# Patient Record
Sex: Male | Born: 1935 | Hispanic: No | Marital: Married | Smoking: Never smoker
Health system: Southern US, Community
[De-identification: ages and names within clinical notes are randomized; demographics above are authoritative.]

## PROBLEM LIST (undated history)

## (undated) DIAGNOSIS — N400 Enlarged prostate without lower urinary tract symptoms: Secondary | ICD-10-CM

## (undated) DIAGNOSIS — J45909 Unspecified asthma, uncomplicated: Secondary | ICD-10-CM

## (undated) DIAGNOSIS — K219 Gastro-esophageal reflux disease without esophagitis: Secondary | ICD-10-CM

## (undated) DIAGNOSIS — IMO0001 Reserved for inherently not codable concepts without codable children: Secondary | ICD-10-CM

## (undated) DIAGNOSIS — M109 Gout, unspecified: Secondary | ICD-10-CM

## (undated) DIAGNOSIS — I10 Essential (primary) hypertension: Secondary | ICD-10-CM

## (undated) HISTORY — DX: Unspecified asthma, uncomplicated: J45.909

## (undated) HISTORY — PX: PROSTATECTOMY: SHX69

---

## 2009-11-19 ENCOUNTER — Emergency Department (HOSPITAL_COMMUNITY): Admission: EM | Admit: 2009-11-19 | Discharge: 2009-11-20 | Payer: Self-pay | Admitting: Emergency Medicine

## 2011-04-02 LAB — DIFFERENTIAL
Basophils Absolute: 0 K/uL (ref 0.0–0.1)
Basophils Relative: 0 % (ref 0–1)
Eosinophils Absolute: 0 K/uL (ref 0.0–0.7)
Eosinophils Relative: 0 % (ref 0–5)
Lymphocytes Relative: 42 % (ref 12–46)
Lymphs Abs: 2.4 K/uL (ref 0.7–4.0)
Monocytes Absolute: 0.6 10*3/uL (ref 0.1–1.0)
Monocytes Relative: 11 % (ref 3–12)
Neutro Abs: 2.7 10*3/uL (ref 1.7–7.7)
Neutrophils Relative %: 47 % (ref 43–77)

## 2011-04-02 LAB — D-DIMER, QUANTITATIVE: D-Dimer, Quant: 0.52 ug{FEU}/mL — ABNORMAL HIGH (ref 0.00–0.48)

## 2011-04-02 LAB — CBC
HCT: 42.2 % (ref 39.0–52.0)
Hemoglobin: 14 g/dL (ref 13.0–17.0)
MCHC: 33.2 g/dL (ref 30.0–36.0)
MCV: 87.8 fL (ref 78.0–100.0)
Platelets: 155 K/uL (ref 150–400)
RBC: 4.8 MIL/uL (ref 4.22–5.81)
RDW: 14.2 % (ref 11.5–15.5)
WBC: 5.7 10*3/uL (ref 4.0–10.5)

## 2011-04-02 LAB — POCT CARDIAC MARKERS
CKMB, poc: <1 ng/mL — ABNORMAL LOW (ref 1.0–8.0)
Myoglobin, poc: 52.5 ng/mL (ref 12–200)

## 2011-04-02 LAB — POCT I-STAT, CHEM 8
Glucose, Bld: 105 mg/dL — ABNORMAL HIGH (ref 70–99)
HCT: 44 % (ref 39.0–52.0)
Hemoglobin: 15 g/dL (ref 13.0–17.0)
Potassium: 4.1 mEq/L (ref 3.5–5.1)
Sodium: 143 mEq/L (ref 135–145)
TCO2: 26 mmol/L (ref 0–100)

## 2015-04-17 LAB — LIPID PANEL
Cholesterol: 208 — AB (ref 0–200)
LDL Cholesterol: 138

## 2015-04-17 LAB — BASIC METABOLIC PANEL: Creatinine: 1.3 (ref <=1.3)

## 2015-05-28 ENCOUNTER — Encounter (HOSPITAL_COMMUNITY): Payer: Self-pay | Admitting: *Deleted

## 2015-05-28 ENCOUNTER — Emergency Department (INDEPENDENT_AMBULATORY_CARE_PROVIDER_SITE_OTHER): Payer: No Typology Code available for payment source

## 2015-05-28 ENCOUNTER — Other Ambulatory Visit: Payer: Self-pay

## 2015-05-28 ENCOUNTER — Emergency Department (HOSPITAL_COMMUNITY)
Admission: EM | Admit: 2015-05-28 | Discharge: 2015-05-28 | Disposition: A | Discharge status: Nursing Home (Includes ICF) | Payer: No Typology Code available for payment source | Source: Home / Self Care | Attending: Emergency Medicine | Admitting: Emergency Medicine

## 2015-05-28 ENCOUNTER — Inpatient Hospital Stay (HOSPITAL_COMMUNITY)
Admission: EM | Admit: 2015-05-28 | Discharge: 2015-05-31 | DRG: 292 | Disposition: A | Discharge status: Home / Self Care | Payer: Medicaid Other | Attending: Internal Medicine | Admitting: Internal Medicine

## 2015-05-28 DIAGNOSIS — E785 Hyperlipidemia, unspecified: Secondary | ICD-10-CM | POA: Diagnosis present

## 2015-05-28 DIAGNOSIS — N400 Enlarged prostate without lower urinary tract symptoms: Secondary | ICD-10-CM | POA: Diagnosis present

## 2015-05-28 DIAGNOSIS — R7989 Other specified abnormal findings of blood chemistry: Secondary | ICD-10-CM | POA: Insufficient documentation

## 2015-05-28 DIAGNOSIS — K219 Gastro-esophageal reflux disease without esophagitis: Secondary | ICD-10-CM | POA: Diagnosis present

## 2015-05-28 DIAGNOSIS — I5031 Acute diastolic (congestive) heart failure: Secondary | ICD-10-CM | POA: Insufficient documentation

## 2015-05-28 DIAGNOSIS — R0602 Shortness of breath: Secondary | ICD-10-CM | POA: Diagnosis present

## 2015-05-28 DIAGNOSIS — N183 Chronic kidney disease, stage 3 (moderate): Secondary | ICD-10-CM | POA: Diagnosis present

## 2015-05-28 DIAGNOSIS — R609 Edema, unspecified: Secondary | ICD-10-CM

## 2015-05-28 DIAGNOSIS — R778 Other specified abnormalities of plasma proteins: Secondary | ICD-10-CM | POA: Insufficient documentation

## 2015-05-28 DIAGNOSIS — I350 Nonrheumatic aortic (valve) stenosis: Secondary | ICD-10-CM | POA: Diagnosis present

## 2015-05-28 DIAGNOSIS — I129 Hypertensive chronic kidney disease with stage 1 through stage 4 chronic kidney disease, or unspecified chronic kidney disease: Secondary | ICD-10-CM | POA: Diagnosis present

## 2015-05-28 DIAGNOSIS — N179 Acute kidney failure, unspecified: Secondary | ICD-10-CM | POA: Diagnosis present

## 2015-05-28 DIAGNOSIS — Z7901 Long term (current) use of anticoagulants: Secondary | ICD-10-CM | POA: Diagnosis not present

## 2015-05-28 DIAGNOSIS — R0609 Other forms of dyspnea: Secondary | ICD-10-CM

## 2015-05-28 DIAGNOSIS — I248 Other forms of acute ischemic heart disease: Secondary | ICD-10-CM | POA: Diagnosis present

## 2015-05-28 DIAGNOSIS — R600 Localized edema: Secondary | ICD-10-CM | POA: Diagnosis present

## 2015-05-28 DIAGNOSIS — I5033 Acute on chronic diastolic (congestive) heart failure: Secondary | ICD-10-CM | POA: Diagnosis present

## 2015-05-28 DIAGNOSIS — I10 Essential (primary) hypertension: Secondary | ICD-10-CM | POA: Diagnosis present

## 2015-05-28 DIAGNOSIS — N189 Chronic kidney disease, unspecified: Secondary | ICD-10-CM | POA: Insufficient documentation

## 2015-05-28 DIAGNOSIS — R010 Benign and innocent cardiac murmurs: Secondary | ICD-10-CM

## 2015-05-28 DIAGNOSIS — Z7982 Long term (current) use of aspirin: Secondary | ICD-10-CM

## 2015-05-28 DIAGNOSIS — R9431 Abnormal electrocardiogram [ECG] [EKG]: Secondary | ICD-10-CM

## 2015-05-28 DIAGNOSIS — M1000 Idiopathic gout, unspecified site: Secondary | ICD-10-CM | POA: Diagnosis present

## 2015-05-28 DIAGNOSIS — R011 Cardiac murmur, unspecified: Secondary | ICD-10-CM

## 2015-05-28 HISTORY — DX: Reserved for inherently not codable concepts without codable children: IMO0001

## 2015-05-28 HISTORY — DX: Essential (primary) hypertension: I10

## 2015-05-28 HISTORY — DX: Gastro-esophageal reflux disease without esophagitis: K21.9

## 2015-05-28 HISTORY — DX: Benign prostatic hyperplasia without lower urinary tract symptoms: N40.0

## 2015-05-28 HISTORY — DX: Gout, unspecified: M10.9

## 2015-05-28 LAB — I-STAT TROPONIN, ED: TROPONIN I, POC: 0.02 ng/mL (ref 0.00–0.08)

## 2015-05-28 LAB — BASIC METABOLIC PANEL
ANION GAP: 4 — AB (ref 5–15)
BUN: 22 mg/dL — AB (ref 6–20)
CALCIUM: 9.2 mg/dL (ref 8.9–10.3)
CHLORIDE: 112 mmol/L — AB (ref 101–111)
CO2: 25 mmol/L (ref 22–32)
Creatinine, Ser: 1.45 mg/dL — ABNORMAL HIGH (ref 0.61–1.24)
GFR calc Af Amer: 52 mL/min — ABNORMAL LOW (ref >60)
GFR calc non Af Amer: 45 mL/min — ABNORMAL LOW (ref >60)
Glucose, Bld: 90 mg/dL (ref 65–99)
POTASSIUM: 4.1 mmol/L (ref 3.5–5.1)
SODIUM: 141 mmol/L (ref 135–145)

## 2015-05-28 LAB — HEPATIC FUNCTION PANEL
ALK PHOS: 85 U/L (ref 38–126)
ALT: 13 U/L — AB (ref 17–63)
AST: 13 U/L — ABNORMAL LOW (ref 15–41)
Albumin: 3.6 g/dL (ref 3.5–5.0)
BILIRUBIN TOTAL: 0.6 mg/dL (ref 0.3–1.2)
TOTAL PROTEIN: 6.7 g/dL (ref 6.5–8.1)

## 2015-05-28 LAB — CBC
HEMATOCRIT: 35.7 % — AB (ref 39.0–52.0)
HEMOGLOBIN: 11.4 g/dL — AB (ref 13.0–17.0)
MCH: 26.1 pg (ref 26.0–34.0)
MCHC: 31.9 g/dL (ref 30.0–36.0)
MCV: 81.9 fL (ref 78.0–100.0)
Platelets: 146 10*3/uL — ABNORMAL LOW (ref 150–400)
RBC: 4.36 MIL/uL (ref 4.22–5.81)
RDW: 15.3 % (ref 11.5–15.5)
WBC: 6 10*3/uL (ref 4.0–10.5)

## 2015-05-28 LAB — BRAIN NATRIURETIC PEPTIDE: B Natriuretic Peptide: 415.5 pg/mL — ABNORMAL HIGH (ref 0.0–100.0)

## 2015-05-28 MED ORDER — FUROSEMIDE 10 MG/ML IJ SOLN
20.0000 mg | Freq: Every day | INTRAMUSCULAR | Status: DC
  Administered 2015-05-29: 20 mg via INTRAVENOUS
  Filled 2015-05-28 (×2): qty 2

## 2015-05-28 MED ORDER — CARVEDILOL 12.5 MG PO TABS
12.5000 mg | ORAL_TABLET | Freq: Two times a day (BID) | ORAL | Status: DC
  Administered 2015-05-29 (×2): 12.5 mg via ORAL
  Filled 2015-05-28 (×6): qty 1

## 2015-05-28 MED ORDER — HEPARIN SODIUM (PORCINE) 5000 UNIT/ML IJ SOLN
5000.0000 [IU] | Freq: Three times a day (TID) | INTRAMUSCULAR | Status: DC
  Administered 2015-05-29 – 2015-05-31 (×8): 5000 [IU] via SUBCUTANEOUS
  Filled 2015-05-28 (×11): qty 1

## 2015-05-28 MED ORDER — HYDRALAZINE HCL 20 MG/ML IJ SOLN
5.0000 mg | Freq: Once | INTRAMUSCULAR | Status: AC
  Administered 2015-05-28: 5 mg via INTRAVENOUS
  Filled 2015-05-28: qty 1

## 2015-05-28 MED ORDER — ASPIRIN EC 81 MG PO TBEC
81.0000 mg | DELAYED_RELEASE_TABLET | Freq: Every day | ORAL | Status: DC
  Administered 2015-05-29 – 2015-05-31 (×3): 81 mg via ORAL
  Filled 2015-05-28 (×3): qty 1

## 2015-05-28 MED ORDER — SODIUM CHLORIDE 0.9 % IV SOLN
250.0000 mL | INTRAVENOUS | Status: DC | PRN
  Administered 2015-05-29: 250 mL via INTRAVENOUS

## 2015-05-28 MED ORDER — FUROSEMIDE 10 MG/ML IJ SOLN
40.0000 mg | Freq: Once | INTRAMUSCULAR | Status: AC
  Administered 2015-05-28: 40 mg via INTRAVENOUS
  Filled 2015-05-28: qty 4

## 2015-05-28 MED ORDER — HYDRALAZINE HCL 20 MG/ML IJ SOLN: 5.0000 mg | INTRAMUSCULAR | Status: DC | PRN

## 2015-05-28 MED ORDER — ONDANSETRON HCL 4 MG/2ML IJ SOLN: 4.0000 mg | Freq: Four times a day (QID) | INTRAMUSCULAR | Status: DC | PRN

## 2015-05-28 MED ORDER — ACETAMINOPHEN 325 MG PO TABS: 650.0000 mg | ORAL_TABLET | ORAL | Status: DC | PRN

## 2015-05-28 MED ORDER — SODIUM CHLORIDE 0.9 % IJ SOLN
3.0000 mL | Freq: Two times a day (BID) | INTRAMUSCULAR | Status: DC
  Administered 2015-05-29 – 2015-05-30 (×4): 3 mL via INTRAVENOUS

## 2015-05-28 MED ORDER — SODIUM CHLORIDE 0.9 % IJ SOLN
3.0000 mL | INTRAMUSCULAR | Status: DC | PRN
  Administered 2015-05-29: 3 mL via INTRAVENOUS
  Filled 2015-05-28: qty 3

## 2015-05-28 NOTE — ED Provider Notes (Signed)
CSN: [id number]     Arrival date & time 05/28/15  1755 History   First MD Initiated Contact with Patient 05/28/15 1922     Chief Complaint  Patient presents with  . Shortness of Breath     (Consider location/radiation/quality/duration/timing/severity/associated sxs/prior Treatment) Patient is a 79 y.o. male presenting with shortness of breath. The history is provided by the patient. No language interpreter was used.  Shortness of Breath Severity:  Moderate Onset quality:  Gradual Duration:  3 days Timing:  Constant Progression:  Worsening Chronicity:  New Context: URI   Relieved by:  Nothing Worsened by:  Nothing tried Ineffective treatments:  None tried Associated symptoms: no abdominal pain, no chest pain, no cough and no diaphoresis   Pt seen at Urgent care by Dr. Piedad Climes and sent to ED for evaluation second to murmur and shortness of breath.  Pt reports they were told pt needs an Echo and labs.   Past Medical History  Diagnosis Date  . Hypertension    History reviewed. No pertinent past surgical history. History reviewed. No pertinent family history. History  Substance Use Topics  . Smoking status: Never Smoker   . Smokeless tobacco: Not on file  . Alcohol Use: No    Review of Systems  Constitutional: Negative for diaphoresis.  HENT: Negative for congestion.   Respiratory: Positive for shortness of breath. Negative for cough.   Cardiovascular: Negative for chest pain.  Gastrointestinal: Negative for nausea, abdominal pain and diarrhea.  All other systems reviewed and are negative.     Allergies  Review of patient's allergies indicates no known allergies.  Home Medications   Prior to Admission medications   Medication Sig Start Date End Date Taking? Authorizing Provider  carvedilol (COREG) 12.5 MG tablet Take 12.5 mg by mouth 2 (two) times daily with a meal.    Historical Provider, MD   BP 188/86 mmHg  Pulse 65  Temp(Src) 98 F (36.7 C) (Oral)  Resp 16   SpO2 100% Physical Exam  Constitutional: He appears well-developed and well-nourished.  HENT:  Head: Normocephalic.  Right Ear: External ear normal.  Left Ear: External ear normal.  Eyes: Conjunctivae and EOM are normal. Pupils are equal, round, and reactive to light.  Neck: Normal range of motion. Neck supple.  Cardiovascular: Normal rate and intact distal pulses.   Murmur heard. Loud murmur  Pulmonary/Chest: Effort normal and breath sounds normal. He exhibits no tenderness.  Abdominal: Soft.  Musculoskeletal: Normal range of motion.  Neurological: He is alert.  Skin: Skin is warm.  Psychiatric: He has a normal mood and affect.  Nursing note and vitals reviewed.   ED Course  Procedures (including critical care time) Labs Review Labs Reviewed  CBC - Abnormal; Notable for the following:    Hemoglobin 11.4 (*)    HCT 35.7 (*)    Platelets 146 (*)    All other components within normal limits  BASIC METABOLIC PANEL - Abnormal; Notable for the following:    Chloride 112 (*)    BUN 22 (*)    Creatinine, Ser 1.45 (*)    GFR calc non Af Amer 45 (*)    GFR calc Af Amer 52 (*)    Anion gap 4 (*)    All other components within normal limits  BRAIN NATRIURETIC PEPTIDE  I-STAT TROPOININ, ED    Imaging Review Dg Chest 2 View  05/28/2015   CLINICAL DATA:  Shortness of breath  EXAM: CHEST  2 VIEW  COMPARISON:  CTA chest dated 01/19/2009  FINDINGS: Lungs are clear.  No pleural effusion or pneumothorax.  The heart is top-normal in size.  Mild degenerative changes of the visualized thoracolumbar spine.  IMPRESSION: No evidence of acute cardiopulmonary disease.   Electronically Signed   By: Charline BillsSriyesh  Krishnan M.D.   On: 05/28/2015 17:21     EKG Interpretation   Date/Time:  Monday May 28 2015 21:25:18 EDT Ventricular Rate:  65 PR Interval:  157 QRS Duration: 87 QT Interval:  389 QTC Calculation: 404 R Axis:   61 Text Interpretation:  Sinus rhythm Biatrial enlargement LVH with  secondary  repolarization abnormality Anterior ST elevation, probably due to LVH  Confirmed by POLLINA  MD, CHRISTOPHER [phone]([id number]) on 05/28/2015 9:29:00 PM      MDM  Urgent care notes reviewed,  Chest xray and ekg reviewed.     slight elevation of bun and creat,  BNp elevated at 415.  Pt has loud murmur,  Family reports they are unaware of pt having a murmur in the past. Iv lasix ordered   Final diagnoses:  Shortness of breath  Edema    I spoke to Dr. Clyde LundborgNiu who will admit for evaluation and treatment.    Lonia SkinnerLeslie K AlmenaSofia, PA-C 05/28/15 2211  Gilda Creasehristopher J Pollina, MD 05/28/15 2217

## 2015-05-28 NOTE — ED Notes (Signed)
Pt  Reports  He  Is   Short  Of  Breath       -     Pt  Reports       He  Uses  An   Inhaler            [place]  [place]  [place]  [place]             He  denys  Any  Chest pain     -  Family   Reports  He  Feet  Are  Swelling         He is  Sitting  Upright  On  The  Exam table  And  He  Is  In no  Acute  /  Severe  Distress     Skin is  Warm      And  Dry

## 2015-05-28 NOTE — ED Notes (Signed)
Pt placed in a gown and hooked up to the monitor 5 lead, BP cuff and pulse ox

## 2015-05-28 NOTE — H&P (Signed)
[facility] History and Physical  Draydon Clairmont ZOX:[id number] DOB: [birth year] DOA: 05/28/2015  Referring physician: ED physician PCP: No primary care provider on file.  Specialists:   Chief Complaint: Shortness of breath  HPI: [name] is a 79 y.o. male with PMH of hypertension, BPH, GERD, gout, who presents with shortness breath.  Patient cannot speaking English, is accompanied by her son and daughter-in-law who translated history. Per her son, patient started having shortness of breath since yesterday. His shortness of breath is getting worse with laying down. No cough, fever, chills, chest pain. He also reports having bilateral leg edema in the past several days. Patient does not have history of recent long distant traveling. No tenderness over calf areas.  Currently patient denies fever, chills, running nose, ear pain, headaches, cough, chest pain, abdominal pain, diarrhea, constipation, dysuria, urgency, frequency, hematuria, skin rashes, joint pain. No unilateral weakness, numbness or tingling sensations. No vision change or hearing loss.  In ED, patient was found to have AKI, negative troponin, BNP 415.5, CBC 6.0, negative chest x-ray, temperature normal, no tachycardia. 3/6 harsh systolic murmur over aortic area. Patient is admitted to inpatient for further evaluation and treatment.  Where does patient live?   At home    Can patient participate in ADLs?   Some   Review of Systems:   General: no fevers, chills, has poor appetite, has fatigue HEENT: no blurry vision, hearing changes or sore throat Pulm: has dyspnea, No coughing, wheezing CV: no chest pain, palpitations Abd: no nausea, vomiting, abdominal pain, diarrhea, constipation GU: no dysuria, burning on urination, increased urinary frequency, hematuria  Ext: has leg edema Neuro: no unilateral weakness, numbness, or tingling, no vision change or hearing loss Skin: no rash MSK: No muscle spasm, no deformity, no  limitation of range of movement in spin Heme: No easy bruising.  Travel history: No recent long distant travel.  Allergy: No Known Allergies  Past Medical History  Diagnosis Date  . Hypertension   . Gouty arthritis of toe of right foot   . GERD (gastroesophageal reflux disease)   . Shortness of breath dyspnea   . BPH (benign prostatic hyperplasia)     Past Surgical History  Procedure Laterality Date  . Prostatectomy      Social History:  reports that he has never smoked. He does not have any smokeless tobacco history on file. He reports that he does not drink alcohol. His drug history is not on file.  Family History:  Family History  Problem Relation Age of Onset  . Hypertension Father   . Hypertension Sister   . Hypertension Brother      Prior to Admission medications   Medication Sig Start Date End Date Taking? Authorizing Provider  carvedilol (COREG) 12.5 MG tablet Take 12.5 mg by mouth 2 (two) times daily with a meal.   Yes Historical Provider, MD    Physical Exam: Filed Vitals:   05/28/15 2145 05/28/15 2200 05/28/15 2230 05/28/15 2330  BP:  171/76 170/71 158/67  Pulse: 66 66 84 81  Temp:    98.3 F (36.8 C)  TempSrc:    Oral  Resp: Height:     (1.6 m)  Weight:    62.642 kg (138 lb 1.6 oz)  SpO2: 100% 100% 100% 100%   General: Not in acute distress HEENT:       Eyes: PERRL, EOMI, no scleral icterus.       ENT: No discharge from the  ears and nose, no pharynx injection, no tonsillar enlargement.        Neck: positive JVD, no bruit, no mass felt. Heme: No neck lymph node enlargement. Cardiac: S1/S2, RRR, 3/6 harsh systolic murmurs, No gallops or rubs. Pulm: has fine crackles bilaterally. No wheezing, rhonchi or rubs. Abd: Soft, nondistended, nontender, no rebound pain, no organomegaly, BS present. Ext: 1+ pitting leg edema bilaterally. 2+DP/PT pulse bilaterally. Musculoskeletal: No joint deformities, No joint redness or warmth, no  limitation of ROM in spin. Skin: No rashes.  Neuro: Alert, oriented X3, cranial nerves II-XII grossly intact, muscle strength 5/5 in all extremities, sensation to light touch intact.  Psych: Patient is not psychotic, no suicidal or hemocidal ideation.  Labs on Admission:  Basic Metabolic Panel:  Recent Labs Lab 05/28/15 1813  NA 141  K 4.1  CL 112*  CO2 25  GLUCOSE 90  BUN 22*  CREATININE 1.45*  CALCIUM 9.2   Liver Function Tests:  Recent Labs Lab 05/28/15 1813  AST 13*  ALT 13*  ALKPHOS 85  BILITOT 0.6  PROT 6.7  ALBUMIN 3.6   No results for input(s): LIPASE, AMYLASE in the last 168 hours. No results for input(s): AMMONIA in the last 168 hours. CBC:  Recent Labs Lab 05/28/15 1813  WBC 6.0  HGB 11.4*  HCT 35.7*  MCV 81.9  PLT 146*   Cardiac Enzymes:  Recent Labs Lab 05/29/15 0022  TROPONINI 0.04*    BNP (last 3 results)  Recent Labs  05/28/15 1813  BNP 415.5*    ProBNP (last 3 results) No results for input(s): PROBNP in the last 8760 hours.  CBG: No results for input(s): GLUCAP in the last 168 hours.  Radiological Exams on Admission: Dg Chest 2 View  05/28/2015   CLINICAL DATA:  Shortness of breath  EXAM: CHEST  2 VIEW  COMPARISON:  CTA chest dated 01/19/2009  FINDINGS: Lungs are clear.  No pleural effusion or pneumothorax.  The heart is top-normal in size.  Mild degenerative changes of the visualized thoracolumbar spine.  IMPRESSION: No evidence of acute cardiopulmonary disease.   Electronically Signed   By: Charline BillsSriyesh  Krishnan M.D.   On: 05/28/2015 17:21    EKG: Independently reviewed.  Abnormal findings: T-wave inversion in lateral leads, inferior leads and V5, bilateral atrial enlargement   Assessment/Plan Principal Problem:   SOB (shortness of breath) Active Problems:   AKI (acute kidney injury)   Bilateral leg edema   Essential hypertension  SOB: It is most likely caused by acute congestive heart failure. Likely due to aortic  stenosis given his harsh systolic murmur over aortic areas. No pneumonia on chest x-ray. Pulmonary embolism is unlikely given no any chest pain.  -will admit to Telemetry bed. -Patient received 1 dose of Lasix 40 mg by IV in emergency room, will decrease to 20 mg daily given patient is Lasix nave.  -start ASA  -continue home Coreg -will cycle CE X3 -will check TSH, A1c, FLP -will get 2-D echo to evaluate EF -strict In/Out -Daily body weight. -heart diet -No on ACEI because of AKI  HTN: Blood pressure is elevated at 158/67 on admission. The patient is only taking Coreg at home, 12.5 mg twice a day. -Continue Coreg -On Lasix as above -Hydralazine IV when necessary  AKI: Likely secondary to cardiorenal syndrom - will give low dose of lasix, with understanding that this may worsen her renal function. However, I am hoping this will not happen due to the Starling mechanism. Her  kidney may be better perfused with improved cardiac function with good diuresis. -Check FeNa  -US-renal -Follow up renal function by BMP -Avoid ACEI and NSAIDs   DVT ppx: SQ Heparin    Code Status: Full code Family Communication: yes, patient's son and daughter-in-law at bed side Disposition Plan: Admit to inpatient   Date of Service 05/29/2015    Lorretta Harp [facility] Pager [phone]  If 7PM-7AM, please contact night-coverage [email or web address] Password [email or web address] 05/29/2015, 3:13 AM

## 2015-05-28 NOTE — ED Provider Notes (Signed)
Patient presented to the ER with shortness of breath. Patient reports that over the last few years he has noticed intermittent difficulty breathing that he has attributed to allergies. In the last 2 days, however, he has noticed increased shortness of breath. He has had swelling of his legs and has noticed that he gets very short of breath if he exerts himself. He cannot walk up stairs or walk any distance because of severe shortness of breath. This is a drastic change. He has not had any chest pain associated with symptoms.  Face to face Exam: HEENT - PERRLA Lungs - decreased bilateral breath sounds Heart - RRR, grade 3/6 systolic murmur at steral border, radiates to carotids Abd - S/NT/ND Neuro - alert, oriented x3 Extremities - 1+ pitting edema  Plan: Presents to the ER for evaluation of lower extremity swelling, shortness of breath and severe dyspnea on exertion. Patient has a murmur which family is unaware of previously. Murmur is consistent with aortic stenosis murmur. Patient does have a mildly elevated BNP, likely some mild CHF. Patient will require hospitalization for further workup and blood pressure control. Will likely require echo.  Gilda Creasehristopher J Mckinna Demars, MD 05/28/15 2121

## 2015-05-28 NOTE — ED Notes (Signed)
Pt sent over from urgent care with an abnormal EKG, went there for shortness of breath and swelling to his feet, no distress noted

## 2015-05-28 NOTE — ED Provider Notes (Signed)
CSN: [id number]     Arrival date & time 05/28/15  1425 History   First MD Initiated Contact with Patient 05/28/15 1644     Chief Complaint  Patient presents with  . Shortness of Breath   (Consider location/radiation/quality/duration/timing/severity/associated sxs/prior Treatment) HPI  He is a 79 year old man here with his family for evaluation of shortness of breath. Family members act as interpreters. He reports shortness of breath that started yesterday. He also states he has had swelling in his legs for the last 2 days. He has never had this swelling before. He denies any fevers, nasal congestion, sore throat, cough. No chest pain. He has a history of hypertension and is taking Coreg for this. He has been taking his medication. He does not have a doctor yet. His daughter states that she was instructed to call the office after June 6 to get him a new patient appointment.  Past Medical History  Diagnosis Date  . Hypertension    History reviewed. No pertinent past surgical history. History reviewed. No pertinent family history. History  Substance Use Topics  . Smoking status: Never Smoker   . Smokeless tobacco: Not on file  . Alcohol Use: No    Review of Systems As in history of present illness Allergies  Review of patient's allergies indicates no known allergies.  Home Medications   Prior to Admission medications   Medication Sig Start Date End Date Taking? Authorizing Provider  carvedilol (COREG) 12.5 MG tablet Take 12.5 mg by mouth 2 (two) times daily with a meal.   Yes Historical Provider, MD   BP 182/72 mmHg  Pulse 63  Temp(Src) 98.3 F (36.8 C) (Oral)  Resp 24  SpO2 98% Physical Exam  Constitutional: He is oriented to person, place, and time. He appears well-developed and well-nourished. No distress.  Neck: Neck supple.  Cardiovascular: Normal rate and regular rhythm.   Murmur (harsh systolic ejection murmur at right upper sternal border) heard. Pulmonary/Chest:  Effort normal and breath sounds normal. No respiratory distress. He has no wheezes. He has no rales.  Musculoskeletal: He exhibits edema (2+ in bilateral lower extremities to upper shins). He exhibits no tenderness.  Neurological: He is alert and oriented to person, place, and time.    ED Course  Procedures (including critical care time) ED ECG REPORT   Date: 05/28/2015  Rate: 60  Rhythm: normal sinus rhythm  QRS Axis: normal  Intervals: normal  ST/T Wave abnormalities: T wave inversions in I, II, III, aVF, V4-6  Conduction Disutrbances:none  Narrative Interpretation: NSR with T wave inversions in inferiolateral leads, concern for strain vs LVH with repolarization abnormality  Old EKG Reviewed: none available  I have personally reviewed the EKG tracing and agree with the computerized printout as noted.  Labs Review Labs Reviewed - No data to display  Imaging Review Dg Chest 2 View  05/28/2015   CLINICAL DATA:  Shortness of breath  EXAM: CHEST  2 VIEW  COMPARISON:  CTA chest dated 01/19/2009  FINDINGS: Lungs are clear.  No pleural effusion or pneumothorax.  The heart is top-normal in size.  Mild degenerative changes of the visualized thoracolumbar spine.  IMPRESSION: No evidence of acute cardiopulmonary disease.   Electronically Signed   By: Charline Bills M.D.   On: 05/28/2015 17:21     MDM   1. Heart murmur previously undiagnosed   2. Shortness of breath   3. Abnormal EKG    With new diagnosis of heart murmur and abnormality seen on  EKG, [facility]. His vital signs are stable and he is without chest pain. He is stable for transfer via shuttle.    Charm RingsErin J Jarome Trull, MD 05/28/15 1726

## 2015-05-29 ENCOUNTER — Inpatient Hospital Stay (HOSPITAL_COMMUNITY): Payer: Medicaid Other

## 2015-05-29 ENCOUNTER — Encounter (HOSPITAL_COMMUNITY): Payer: Self-pay | Admitting: Internal Medicine

## 2015-05-29 DIAGNOSIS — I110 Hypertensive heart disease with heart failure: Secondary | ICD-10-CM

## 2015-05-29 DIAGNOSIS — I5030 Unspecified diastolic (congestive) heart failure: Secondary | ICD-10-CM

## 2015-05-29 DIAGNOSIS — I509 Heart failure, unspecified: Secondary | ICD-10-CM

## 2015-05-29 DIAGNOSIS — I350 Nonrheumatic aortic (valve) stenosis: Secondary | ICD-10-CM

## 2015-05-29 DIAGNOSIS — R0602 Shortness of breath: Secondary | ICD-10-CM

## 2015-05-29 DIAGNOSIS — I10 Essential (primary) hypertension: Secondary | ICD-10-CM

## 2015-05-29 LAB — LIPID PANEL
Cholesterol: 255 mg/dL — ABNORMAL HIGH (ref 0–200)
HDL: 43 mg/dL (ref >40)
LDL CALC: 189 mg/dL — AB (ref 0–99)
TRIGLYCERIDES: 114 mg/dL (ref <150)
Total CHOL/HDL Ratio: 5.9 RATIO
VLDL: 23 mg/dL (ref 0–40)

## 2015-05-29 LAB — PROTIME-INR
INR: 1.2 (ref 0.00–1.49)
Prothrombin Time: 15.3 seconds — ABNORMAL HIGH (ref 11.6–15.2)

## 2015-05-29 LAB — BASIC METABOLIC PANEL
Anion gap: 9 (ref 5–15)
BUN: 24 mg/dL — AB (ref 6–20)
CHLORIDE: 108 mmol/L (ref 101–111)
CO2: 23 mmol/L (ref 22–32)
Calcium: 9.2 mg/dL (ref 8.9–10.3)
Creatinine, Ser: 1.43 mg/dL — ABNORMAL HIGH (ref 0.61–1.24)
GFR calc non Af Amer: 46 mL/min — ABNORMAL LOW (ref >60)
GFR, EST AFRICAN AMERICAN: 53 mL/min — AB (ref >60)
Glucose, Bld: 89 mg/dL (ref 65–99)
Potassium: 3.5 mmol/L (ref 3.5–5.1)
Sodium: 140 mmol/L (ref 135–145)

## 2015-05-29 LAB — HEPATIC FUNCTION PANEL
ALK PHOS: 91 U/L (ref 38–126)
ALT: 14 U/L — ABNORMAL LOW (ref 17–63)
AST: 13 U/L — ABNORMAL LOW (ref 15–41)
Albumin: 3.5 g/dL (ref 3.5–5.0)
BILIRUBIN INDIRECT: 0.5 mg/dL (ref 0.3–0.9)
BILIRUBIN TOTAL: 0.6 mg/dL (ref 0.3–1.2)
Bilirubin, Direct: 0.1 mg/dL (ref 0.1–0.5)
Total Protein: 7.1 g/dL (ref 6.5–8.1)

## 2015-05-29 LAB — TROPONIN I
TROPONIN I: 0.05 ng/mL — AB (ref <0.031)
TROPONIN I: 0.04 ng/mL — AB (ref <0.031)
Troponin I: 0.04 ng/mL — ABNORMAL HIGH (ref <0.031)

## 2015-05-29 LAB — TSH: TSH: 3.411 u[IU]/mL (ref 0.350–4.500)

## 2015-05-29 LAB — CREATININE, URINE, RANDOM: CREATININE, URINE: 37.53 mg/dL

## 2015-05-29 LAB — MAGNESIUM: Magnesium: 2 mg/dL (ref 1.7–2.4)

## 2015-05-29 MED ORDER — REGADENOSON 0.4 MG/5ML IV SOLN
0.4000 mg | Freq: Once | INTRAVENOUS | Status: AC
  Administered 2015-05-30: 0.4 mg via INTRAVENOUS
  Filled 2015-05-29: qty 5

## 2015-05-29 MED ORDER — POLYETHYLENE GLYCOL 3350 17 G PO PACK
17.0000 g | PACK | Freq: Every day | ORAL | Status: DC
  Administered 2015-05-29: 17 g via ORAL
  Filled 2015-05-29 (×3): qty 1

## 2015-05-29 MED ORDER — LOSARTAN POTASSIUM 25 MG PO TABS
25.0000 mg | ORAL_TABLET | Freq: Every day | ORAL | Status: DC
  Administered 2015-05-29: 25 mg via ORAL
  Filled 2015-05-29 (×2): qty 1

## 2015-05-29 MED ORDER — ATORVASTATIN CALCIUM 40 MG PO TABS
40.0000 mg | ORAL_TABLET | Freq: Every day | ORAL | Status: DC
  Administered 2015-05-30: 40 mg via ORAL
  Filled 2015-05-29 (×2): qty 1

## 2015-05-29 NOTE — Progress Notes (Signed)
  Echocardiogram 2D Echocardiogram has been performed.  [name] FRANCES 05/29/2015, 10:04 AM

## 2015-05-29 NOTE — Progress Notes (Signed)
[facility] PROGRESS NOTE   [name] HQI:[id number] DOB: 02-Sep-1936 DOA: 05/28/2015 PCP: No primary care provider on file.  HPI/Subjective: Denies chest pain, still has some shortness of breath especially with ambulation. Son and daughter low at bedside translating for Arabic.  Assessment/Plan: Principal Problem:   SOB (shortness of breath) Active Problems:   AKI (acute kidney injury)   Bilateral leg edema   Essential hypertension   Shortness of breath Patient admitted to the hospital with exertional dyspnea, progressively worsened shortness of breath. Patient has had lower extremity edema and orthopnea. Resolved by now. Patient received Fields Lasix and his Coreg continued. 2-D echo did not show significant findings, grade 1 diastolic dysfunction, ejection fraction of 65-70%. Last cardiology to help with exertional dyspnea.  Moderate aortic stenosis Patient has very significant murmur, 2-D echocardiogram showed aortic valve surface area of 1.1 cm. Peak and mean gradients 45 and 26 mmHg respectively.  Acute kidney injury Patient started on small dose of Lasix yesterday, given 40 mg in the ED and 20 daily. Creatinine about the same as yesterday 1.4., Baseline from 6 years ago 1.1.  Essential hypertension Elevated blood pressure, restarted Coreg. We'll adjust medications accordingly.  Code Status: Full Code Family Communication: Plan discussed with the patient. Disposition Plan: Remains inpatient Diet: Diet 2 gram sodium Room service appropriate?: Yes; Fluid consistency:: Thin  Consultants:  Cardiology  Procedures:  None  Antibiotics:  None   Objective: Filed Vitals:   05/29/15 1356  BP: 172/67  Pulse:   Temp:   Resp:     Intake/Output Summary (Last 24 hours) at 05/29/15 1542 Last data filed at 05/29/15 1356  Gross per 24 hour  Intake    240 ml  Output    250 ml  Net    -10 ml   Filed Weights   05/28/15 2330 05/29/15 0541  Weight: 62.642 kg  (138 lb 1.6 oz) 62.324 kg (137 lb 6.4 oz)    Exam: General: Alert and awake, oriented x3, not in any acute distress. HEENT: anicteric sclera, pupils reactive to light and accommodation, EOMI CVS: S1-S2 clear, no murmur rubs or gallops Chest: clear to auscultation bilaterally, no wheezing, rales or rhonchi Abdomen: soft nontender, nondistended, normal bowel sounds, no organomegaly Extremities: no cyanosis, clubbing or edema noted bilaterally Neuro: Cranial nerves II-XII intact, no focal neurological deficits  Data Reviewed: Basic Metabolic Panel:  Recent Labs Lab 05/28/15 1813 05/29/15 0540  NA 141 140  K 4.1 3.5  CL 112* 108  CO2 25 23  GLUCOSE 90 89  BUN 22* 24*  CREATININE 1.45* 1.43*  CALCIUM 9.2 9.2  MG  --  2.0   Liver Function Tests:  Recent Labs Lab 05/28/15 1813  AST 13*  ALT 13*  ALKPHOS 85  BILITOT 0.6  PROT 6.7  ALBUMIN 3.6   No results for input(s): LIPASE, AMYLASE in the last 168 hours. No results for input(s): AMMONIA in the last 168 hours. CBC:  Recent Labs Lab 05/28/15 1813  WBC 6.0  HGB 11.4*  HCT 35.7*  MCV 81.9  PLT 146*   Cardiac Enzymes:  Recent Labs Lab 05/29/15 0022 05/29/15 0540 05/29/15 1200  TROPONINI 0.04* 0.05* 0.04*   BNP (last 3 results)  Recent Labs  05/28/15 1813  BNP 415.5*    ProBNP (last 3 results) No results for input(s): PROBNP in the last 8760 hours.  CBG: No results for input(s): GLUCAP in the last 168 hours.  Micro No results found for this or any previous  visit (from the past 240 hour(s)).   Studies: Dg Chest 2 View  05/28/2015   CLINICAL DATA:  Shortness of breath  EXAM: CHEST  2 VIEW  COMPARISON:  CTA chest dated 01/19/2009  FINDINGS: Lungs are clear.  No pleural effusion or pneumothorax.  The heart is top-normal in size.  Mild degenerative changes of the visualized thoracolumbar spine.  IMPRESSION: No evidence of acute cardiopulmonary disease.   Electronically Signed   By: Carlos BillsSriyesh  Fields  M.D.   On: 05/28/2015 17:21   [place] Renal  05/29/2015   CLINICAL DATA:  Acute kidney injury.  Initial encounter.  EXAM: RENAL / URINARY TRACT ULTRASOUND COMPLETE  COMPARISON:  Limited correlation made with chest CT 11/19/2009.  FINDINGS: Right Kidney:  Length: 9.7 cm. Mild renal cortical thinning and increased echogenicity. There is Fields large cyst in the upper pole measuring 6.1 x 5.3 x 5.1 cm. There is an adjacent smaller cyst measuring 1.5 cm maximally. No hydronephrosis.  Left Kidney:  Length: 8.6 cm. Mild renal cortical thinning and increased echogenicity there is Fields small probable cyst in the lower interpolar region measuring 1.2 cm maximally. No hydronephrosis.  Bladder:  Appears normal for degree of bladder distention. Bilateral ureteral jets noted. The prostate gland is mildly enlarged, measure up to 3.1 cm.  IMPRESSION: 1. Both kidneys demonstrate mild cortical thinning and increased echogenicity consistent with chronic medical renal disease. 2. No evidence of hydronephrosis.  Bilateral ureteral jets present. 3. Bilateral renal cysts.   Electronically Signed   By: Carey BullocksWilliam  Veazey M.D.   On: 05/29/2015 07:13    Scheduled Meds: . aspirin EC  81 mg Oral Daily  . carvedilol  12.5 mg Oral BID WC  . furosemide  20 mg Intravenous Daily  . heparin  5,000 Units Subcutaneous 3 times per day  . sodium chloride  3 mL Intravenous Q12H   Continuous Infusions:      Time spent: 35 minutes    Geising[facility] Endoscopy [facility] Surgery CtrELMAHI,[name]  [facility] Pager [phone] If 7PM-7AM, please contact night-coverage at [email or web address], password [email or web address] 05/29/2015, 3:42 PM  LOS: 1 day

## 2015-05-29 NOTE — Progress Notes (Signed)
PT Cancellation Note  Patient Details Name: Cipriano MileMustafa Louischarles MRN: [id number] DOB: 10/06/1936   Cancelled Treatment:    Reason Eval/Treat Not Completed: PT screened, no needs identified, will sign off   Family present and explained role of PT. They denied pt having any issues with balance or strength. Does not use a cane or walker. Has recently been limited by SOB. RN reports pt has been limited by dyspnea/CHF however has been steady.   Davionna Blacksher 05/29/2015, 3:56 PM Pager [phone]

## 2015-05-29 NOTE — Consult Note (Signed)
Patient ID: [name] MRN: [id number], DOB/AGE: February 24, 1936   Admit date: 05/28/2015   Primary Physician: No primary care provider on file. Primary Cardiologist: New (Dr. Herbie Baltimore)  Pt. Profile:  79 y/o male with h/o HTN and no prior cardiac history, admitted with dyspnea in the setting of acute diastolic HF and severe HTN.   Problem List  Past Medical History  Diagnosis Date  . Hypertension   . Gouty arthritis of toe of right foot   . GERD (gastroesophageal reflux disease)   . Shortness of breath dyspnea   . BPH (benign prostatic hyperplasia)     Past Surgical History  Procedure Laterality Date  . Prostatectomy       Allergies  No Known Allergies  HPI  79 y/o non english speaking male with PMH significant for HTN, GERD, BPH and gout. No prior cardiac history. He presented to [facility] last night with complaints of progressive exertional dyspnea and LEE. Also with orthopnea but denies PND. No chest pain, dizziness, palpitations, syncope/ near syncope.  On presentation to the ED, he was hypertensive with SBP in the 190s. 2D echo shows normal LV systolic function with EF at 65-70% w/o WMA. Grade I DD noted as well as moderate AS with AVA of 1.1 cm2 and mean gradient of 23 mm Hg. Troponins mildly elevated x 3 with flat trend at 0.04, 0.05 and 0.04. EKG shows NSR with LVH. CXR unremarkable but BNP elevated at 415. SCr is elevated at 1.43 (baseline 1.1). Lipid panel also shows elevated LDL at 189 mg/dL.     Home Medications  Prior to Admission medications   Medication Sig Start Date End Date Taking? Authorizing Provider  carvedilol (COREG) 12.5 MG tablet Take 12.5 mg by mouth 2 (two) times daily with a meal.   Yes Historical Provider, MD    Family History  Family History  Problem Relation Age of Onset  . Hypertension Father   . Hypertension Sister   . Hypertension Brother     Social History  History   Social History  . Marital Status: Married    Spouse Name: N/A  .  Number of Children: N/A  . Years of Education: N/A   Occupational History  . Not on file.   Social History Main Topics  . Smoking status: Never Smoker   . Smokeless tobacco: Not on file  . Alcohol Use: No  . Drug Use: Not on file  . Sexual Activity: Not on file   Other Topics Concern  . Not on file   Social History Narrative     Review of Systems General:  No chills, fever, night sweats or weight changes.  Cardiovascular:  No chest pain, dyspnea on exertion, edema, orthopnea, palpitations, paroxysmal nocturnal dyspnea. Dermatological: No rash, lesions/masses Respiratory: No cough, dyspnea Urologic: No hematuria, dysuria Abdominal:   No nausea, vomiting, diarrhea, bright red blood per rectum, melena, or hematemesis Neurologic:  No visual changes, wkns, changes in mental status. All other systems reviewed and are otherwise negative except as noted above.  Physical Exam  Blood pressure 172/67, pulse 64, temperature 98.4 F (36.9 C), temperature source Oral, resp. rate 19, height  (1.6 m), weight 137 lb 6.4 oz (62.324 kg), SpO2 100 %.  General: Pleasant, NAD Psych: Normal affect. Neuro: Alert and oriented X 3. Moves all extremities spontaneously. HEENT: Normal  Neck: Supple without bruits or JVD. Lungs:  Resp regular and unlabored, CTA. Heart: RRR 3/6 mid-late peaking holosystolic murmur best heard at RUSB.  Abdomen: Soft, non-tender, non-distended, BS + x 4.  Extremities: No clubbing, cyanosis or edema. DP/PT/Radials 2+ and equal bilaterally.  Labs  Troponin [facility](Point of Care Test)  Recent Labs  05/28/15 1832  TROPIPOC 0.02    Recent Labs  05/29/15 0022 05/29/15 0540 05/29/15 1200  TROPONINI 0.04* 0.05* 0.04*   Lab Results  Component Value Date   WBC 6.0 05/28/2015   HGB 11.4* 05/28/2015   HCT 35.7* 05/28/2015   MCV 81.9 05/28/2015   PLT 146* 05/28/2015    Recent Labs Lab 05/28/15 1813 05/29/15 0540  NA 141 140  K 4.1 3.5  CL 112* 108  CO2 25  23  BUN 22* 24*  CREATININE 1.45* 1.43*  CALCIUM 9.2 9.2  PROT 6.7  --   BILITOT 0.6  --   ALKPHOS 85  --   ALT 13*  --   AST 13*  --   GLUCOSE 90 89   Lab Results  Component Value Date   CHOL 255* 05/29/2015   HDL 43 05/29/2015   LDLCALC 189* 05/29/2015   TRIG 114 05/29/2015   Lab Results  Component Value Date   DDIMER * 11/19/2009    0.52        AT THE INHOUSE ESTABLISHED CUTOFF VALUE OF 0.48 ug/mL FEU, THIS ASSAY HAS BEEN DOCUMENTED IN THE LITERATURE TO HAVE A SENSITIVITY AND NEGATIVE PREDICTIVE VALUE OF AT LEAST 98 TO 99%.  THE TEST RESULT SHOULD BE CORRELATED WITH AN ASSESSMENT OF THE CLINICAL PROBABILITY OF DVT / VTE.     Radiology/Studies  Dg Chest 2 View  05/28/2015   CLINICAL DATA:  Shortness of breath  EXAM: CHEST  2 VIEW  COMPARISON:  CTA chest dated 01/19/2009  FINDINGS: Lungs are clear.  No pleural effusion or pneumothorax.  The heart is top-normal in size.  Mild degenerative changes of the visualized thoracolumbar spine.  IMPRESSION: No evidence of acute cardiopulmonary disease.   Electronically Signed   By: Charline BillsSriyesh  Krishnan M.D.   On: 05/28/2015 17:21   [place] Renal  05/29/2015   CLINICAL DATA:  Acute kidney injury.  Initial encounter.  EXAM: RENAL / URINARY TRACT ULTRASOUND COMPLETE  COMPARISON:  Limited correlation made with chest CT 11/19/2009.  FINDINGS: Right Kidney:  Length: 9.7 cm. Mild renal cortical thinning and increased echogenicity. There is a large cyst in the upper pole measuring 6.1 x 5.3 x 5.1 cm. There is an adjacent smaller cyst measuring 1.5 cm maximally. No hydronephrosis.  Left Kidney:  Length: 8.6 cm. Mild renal cortical thinning and increased echogenicity there is a small probable cyst in the lower interpolar region measuring 1.2 cm maximally. No hydronephrosis.  Bladder:  Appears normal for degree of bladder distention. Bilateral ureteral jets noted. The prostate gland is mildly enlarged, measure up to 3.1 cm.  IMPRESSION: 1. Both kidneys  demonstrate mild cortical thinning and increased echogenicity consistent with chronic medical renal disease. 2. No evidence of hydronephrosis.  Bilateral ureteral jets present. 3. Bilateral renal cysts.   Electronically Signed   By: Carey BullocksWilliam  Veazey M.D.   On: 05/29/2015 07:13    ECG  NSR with LVH  Echocardiogram  2D echo 05/29/15 Study Conclusions  - Left ventricle: The cavity size was normal. There was moderate concentric hypertrophy. Systolic function was vigorous. The estimated ejection fraction was in the range of 65% to 70%. Wall motion was normal; there were no regional wall motion abnormalities. Doppler parameters are consistent with abnormal left ventricular relaxation (grade 1 diastolic dysfunction). The  E/e&' ratio is between 8-15, suggesting indeterminate LV filling pressure. - Aortic valve: Trileaflet; mildly calcified leaflets. Moderate aortic stenosis -peak and mean gradients of 45 and 26 mmHg, respectively. There was mild regurgitation. Valve area (VTI): 1.12 cm^2. Valve area (Vmax): 1.12 cm^2. Valve area (Vmean): 1.1 cm^2. - Mitral valve: Sclerotic AMVL. Mild MR. Calcified annulus. - Left atrium: The atrium was normal in size. - Tricuspid valve: There was mild regurgitation. - Pulmonary arteries: PA peak pressure: 32 mm Hg (S). - Inferior vena cava: The vessel was normal in size. The respirophasic diameter changes were in the normal range (>= 50%), consistent with normal central venous pressure.  Impressions:  - LVEF 65-70%, moderate LVH, diastolic dysfunction, moderate aortic stenosis - AVA around 1.1 cm2, mild AI, MR and TR, top normal RVSP.    ASSESSMENT AND PLAN  Principal Problem:   SOB (shortness of breath) Active Problems:   AKI (acute kidney injury)   Bilateral leg edema   Essential hypertension   Aortic stenosis, moderate   1. DOE/ Acute Diastolic CHF: likely subsequent to acute on chronic diastolic CHF. 2D echo  shows normal LV systolic function and grade 1 DD. EKG consistent with LHV.  Has accelerated hypertension. He needs further afterload reduction and diuresis. Agree with IV Lasix. Strict I/Os and daily weights. Recommend addition of ARB for better control of BP/ afterload reduction. Will add losartan. Will also plan for NST in the am to r/o possible ischemia.   2. Aortic Stenosis: moderate with AVR of 1.1 cm2 and mean gradient at 23 mgHg. Recommend yearly assessment by TTE.   3. Elevated Troponin: Mildly elevated with flat trend. Also in the setting of renal insufficiency. Likely secondary to demand ischemia in the setting of acute diastolic CHF and accelerated hypertension. However will plan for NST in the am to r/o ischemia.  4. AKI: SCr 1.43. Baseline is 1.1. Continue to monitor while diuresing.   5. HLD: elevated LDL at 189 ml/dL. Recommend statin if normal hepatic function. Will need to check LFTs.   6. Accelerated HTN: initial BP 190s systolic. Now in the 170s. Will add ARB for better BP control. Losartan 25 mg.   Signed, Robbie Lis, PA-C 05/29/2015, 6:56 PM   On evaluating the patient along with the [facility], PA-C. We actually saw the patient and conducted the interview process examination together. We used the patient's family member as an Clinical research associate. The patient voiced full understanding of the entire conversation.  Essentially present Arabic speaking, healthy-appearing 79 year old gentleman with progressively worsening exertional dyspnea over the last month or so. Over the last couple days he had worsening symptoms with a PND orthopnea and mild edema. Upon initial evaluation was found to have accelerated hypertension on examination along with a systolic ejection murmur. Transthoracic echocardiogram indicated preserved LV ejection fraction with moderate LVH and diastolic dysfunction likely not fully because of the extent of actual dysfunction. There signs of likely  hypertensive heart disease as well as LVH from moderate aortic stenosis.   I discussed the recommendations as indicated above with Ms. Simmons. Basically most likely etiology for his exertional dyspnea is a combination of accelerated hypertension with diastolic dysfunction mediated exertional dyspnea. With profound hypertension given the moderate aortic stenosis this is exacerbated with increased left ventricular filling pressures and therefore to PND and orthopnea. He does already feel better after diuresis. I agree with adding an ARB for blood pressure control to the beta blocker. Given the similarity of disease between air stenosis and coronary  artery disease, it is prudent to proceed to an ischemic evaluation to exclude any coronary ischemia mediated exertional dyspnea.  Otherwise Revonda Standard he is pretty close to being adequately diuresed and would probably benefit from being almost an oral diuretic. We will need to monitor his aortic valve probably with an echocardiogram next year. Will need close monitoring of his blood pressure to avoid further accelerated hypertension.  Otherwise of aggressively treat cardiac risk factors.  Patient will be followed tomorrow following his stress test. He'll be made n.p.o. At midnight for [facility].    Marykay Lex, M.D., M.S. Interventional Cardiologist   Pager # [phone]

## 2015-05-29 NOTE — Progress Notes (Signed)
OT Cancellation Note  Patient Details Name: [name] MRN: [id number] DOB: 07/06/1936   Cancelled Treatment:    Reason Eval/Treat Not Completed: Medical issues which prohibited therapy (elevated troponins)  Pho[facility] Indian [facility] CenterWARD,HILLARY  Khoury Siemon, OTR/L  [phone] 05/29/2015 05/29/2015, 11:06 AM

## 2015-05-29 NOTE — Progress Notes (Signed)
PT Cancellation Note  Patient Details Name: [name] MRN: [id number] DOB: 01/09/1936   Cancelled Treatment:    Reason Eval/Treat Not Completed: Patient not medically ready. Noted pt with mildly elevated Trop 1 that has increased. Will await further test results and MD opinion re: increasing activity/proceeding with PT eval. Will follow.   Revella Shelton 05/29/2015, 8:20 AM  Pager [phone]

## 2015-05-30 ENCOUNTER — Encounter (HOSPITAL_COMMUNITY): Payer: No Typology Code available for payment source | Attending: Cardiology

## 2015-05-30 ENCOUNTER — Inpatient Hospital Stay (HOSPITAL_COMMUNITY): Payer: Medicaid Other

## 2015-05-30 DIAGNOSIS — R0609 Other forms of dyspnea: Secondary | ICD-10-CM

## 2015-05-30 DIAGNOSIS — I5031 Acute diastolic (congestive) heart failure: Secondary | ICD-10-CM

## 2015-05-30 DIAGNOSIS — N189 Chronic kidney disease, unspecified: Secondary | ICD-10-CM | POA: Insufficient documentation

## 2015-05-30 DIAGNOSIS — I10 Essential (primary) hypertension: Secondary | ICD-10-CM | POA: Insufficient documentation

## 2015-05-30 DIAGNOSIS — R7989 Other specified abnormal findings of blood chemistry: Secondary | ICD-10-CM

## 2015-05-30 DIAGNOSIS — N179 Acute kidney failure, unspecified: Secondary | ICD-10-CM

## 2015-05-30 DIAGNOSIS — R778 Other specified abnormalities of plasma proteins: Secondary | ICD-10-CM | POA: Insufficient documentation

## 2015-05-30 DIAGNOSIS — N183 Chronic kidney disease, stage 3 (moderate): Secondary | ICD-10-CM

## 2015-05-30 LAB — NM MYOCAR MULTI W/SPECT W/WALL MOTION / EF
CHL CUP NUCLEAR SRS: 1
CHL CUP NUCLEAR SSS: 4
LV sys vol: 40 mL
LVDIAVOL: 85 mL
RATE: 0.3
SDS: 3
TID: 1.14

## 2015-05-30 LAB — BASIC METABOLIC PANEL
ANION GAP: 10 (ref 5–15)
BUN: 26 mg/dL — AB (ref 6–20)
CO2: 25 mmol/L (ref 22–32)
CREATININE: 1.58 mg/dL — AB (ref 0.61–1.24)
Calcium: 8.8 mg/dL — ABNORMAL LOW (ref 8.9–10.3)
Chloride: 104 mmol/L (ref 101–111)
GFR calc Af Amer: 47 mL/min — ABNORMAL LOW (ref >60)
GFR calc non Af Amer: 41 mL/min — ABNORMAL LOW (ref >60)
Glucose, Bld: 96 mg/dL (ref 65–99)
POTASSIUM: 3.6 mmol/L (ref 3.5–5.1)
Sodium: 139 mmol/L (ref 135–145)

## 2015-05-30 LAB — HEMOGLOBIN A1C
HEMOGLOBIN A1C: 6.2 % — AB (ref 4.8–5.6)
MEAN PLASMA GLUCOSE: 131 mg/dL

## 2015-05-30 LAB — UREA NITROGEN, URINE: UREA NITROGEN UR: 281 mg/dL

## 2015-05-30 MED ORDER — AMLODIPINE BESYLATE 5 MG PO TABS
5.0000 mg | ORAL_TABLET | Freq: Every day | ORAL | Status: DC
  Filled 2015-05-30: qty 1

## 2015-05-30 MED ORDER — TECHNETIUM TC 99M SESTAMIBI GENERIC - CARDIOLITE
10.0000 | Freq: Once | INTRAVENOUS | Status: AC | PRN
  Administered 2015-05-30: 10 via INTRAVENOUS

## 2015-05-30 MED ORDER — REGADENOSON 0.4 MG/5ML IV SOLN
INTRAVENOUS | Status: AC
  Administered 2015-05-30: 0.4 mg via INTRAVENOUS
  Filled 2015-05-30: qty 5

## 2015-05-30 MED ORDER — PNEUMOCOCCAL VAC POLYVALENT 25 MCG/0.5ML IJ INJ
0.5000 mL | INJECTION | INTRAMUSCULAR | Status: AC
  Administered 2015-05-31: 0.5 mL via INTRAMUSCULAR
  Filled 2015-05-30: qty 0.5

## 2015-05-30 MED ORDER — ISOSORBIDE MONONITRATE ER 30 MG PO TB24
30.0000 mg | ORAL_TABLET | Freq: Every day | ORAL | Status: DC
  Administered 2015-05-30 – 2015-05-31 (×2): 30 mg via ORAL
  Filled 2015-05-30 (×2): qty 1

## 2015-05-30 MED ORDER — CARVEDILOL 25 MG PO TABS
25.0000 mg | ORAL_TABLET | Freq: Two times a day (BID) | ORAL | Status: DC
  Administered 2015-05-30 – 2015-05-31 (×2): 25 mg via ORAL
  Filled 2015-05-30 (×4): qty 1

## 2015-05-30 MED ORDER — TECHNETIUM TC 99M SESTAMIBI - CARDIOLITE
30.0000 | Freq: Once | INTRAVENOUS | Status: AC | PRN
Start: 2015-05-30 — End: 2015-05-30
  Administered 2015-05-30: 30 via INTRAVENOUS

## 2015-05-30 MED ORDER — HYDRALAZINE HCL 25 MG PO TABS
25.0000 mg | ORAL_TABLET | Freq: Three times a day (TID) | ORAL | Status: DC
  Administered 2015-05-30 – 2015-05-31 (×4): 25 mg via ORAL
  Filled 2015-05-30 (×6): qty 1

## 2015-05-30 NOTE — Progress Notes (Signed)
Patient ID: [name], male   DOB: 1936-05-17, 79 y.o.   MRN: [id number] [facility] PROGRESS NOTE  [name] JYN:[id number] DOB: 1936/06/06 DOA: 05/28/2015 PCP:   Brief narrative:    79 y.o. male with PMH of hypertension who presented to [facility] ED with worsening shortness of breath for past 24 hours prior to this admission. Pt reported shortness of breath is worse when he lays down and with exertion. Pt reported associated lower extremity swelling. No chest pain, no palpitations. No fevers or chills.  In ED, patient was found to have mild elevation in troponin level. His 12 lead EKG showed sinus rhythm. BNP was 415. Pt was seen by cardiology in consultation and he had a stress test done which did not show ischemia. He will be discharged home 6/2 so we can observe him after stress test. Case management to assist with medication needs.  Assessment/Plan:    Principal Problem: Dyspnea on exertion / Acute Diastolic CHF - BNP on this admission mildly elevated at 415. 2 D ECHO on this admission showed essentially preserved EF and grade 1 diastolic dysfunction - Cardio has seen the pt in consultation - The 12 lead EKG consistent with LVH - Continue current meds: aspirin, statin therapy, coreg 25 mg PO BID, Bidil. - Losartan contraindicated due to acute on chronic kidney disease stage 3  Active Problems: Aortic Stenosis - Pt has moderate AVR of 1.1 cm2 and mean gradient at 23 mgHg.  - Per cardio, recommend yearly assessment by TTE.   Elevated Troponin - Mild elevation, likely demand ischemia from acute diastolic CHF and CKD - Stress test done 6/1 with no ischemic changes - Continue hydralazine / Imdur - Continue aspirin and statin therapy  Acute on chronic kidney disease, stage 3 - Creatinine 1.45 on admission and this am 1.58 (baseline is 1.1) - Will continue to monitor  Dyslipidemia - Continue statin therapy   Accelerated HTN - Continue Bidil, coreg   DVT Prophylaxis  - Heparin  subQ   Code Status: Full.  Family Communication:  Family not at the bedside this am Disposition Plan: Home 6/2.  IV access:  Peripheral IV  Procedures and diagnostic studies:    Dg Chest 2 View 05/28/2015  No evidence of acute cardiopulmonary disease.     US Renal 05/29/2015 1. Both kidneys demonstrate mild cortical thinning and increased echogenicity consistent with chronic medical renal disease. 2. No evidence of hydronephrosis.  Bilateral ureteral jets present. 3. Bilateral renal cysts.   Electronically Signed   By: Carey Bullocks M.D.   On: 05/29/2015 07:13   Nm Myocar Multi W/spect W/wall Motion / Ef 05/30/2015    There was no ST segment deviation noted during stress.  The study is normal.  This is a low risk study.  The left ventricular ejection fraction is low normal (53%).   Low risk pharmacological stress nuclear study with normal perfusion and  normal left ventricular regional motion and low normal global systolic  function.     Medical Consultants:  Cardiology  Other Consultants:  None   IAnti-Infectives:   None    Manson Passey, MD  [facility] Pager [phone]  Time spent in minutes:  15 minutes  If 7PM-7AM, please contact night-coverage [email or web address] Password [email or web address] 05/30/2015, 7:08 PM   LOS: 2 days    HPI/Subjective: No acute overnight events. Patient reports no shortness of breath.   Objective: Filed Vitals:   05/30/15 0946 05/30/15 1230 05/30/15 1343 05/30/15 1721  BP: 159/78 181/70  134/64 160/74  Pulse:   72 76  Temp:   97.7 F (36.5 C)   TempSrc:   Oral   Resp: 22  20   Height:      Weight:      SpO2:   100%     Intake/Output Summary (Last 24 hours) at 05/30/15 1908 Last data filed at 05/30/15 0926  Gross per 24 hour  Intake      0 ml  Output      0 ml  Net      0 ml    Exam:   General:  Pt is alert, follows commands appropriately, not in acute distress  Cardiovascular: Regular rate and rhythm, S1/S2 (+); SEM appreciated    Respiratory: Clear to auscultation bilaterally, no wheezing, no crackles, no rhonchi  Abdomen: Soft, non tender, non distended, bowel sounds present  Extremities: + LE edema, pulses DP and PT palpable bilaterally  Neuro: Grossly nonfocal  Data Reviewed: Basic Metabolic Panel:  Recent Labs Lab 05/28/15 1813 05/29/15 0540 05/30/15 0448  NA 141 140 139  K 4.1 3.5 3.6  CL 112* 108 104  CO2 25 23 25   GLUCOSE 90 89 96  BUN 22* 24* 26*  CREATININE 1.45* 1.43* 1.58*  CALCIUM 9.2 9.2 8.8*  MG  --  2.0  --    Liver Function Tests:  Recent Labs Lab 05/28/15 1813 05/29/15 2201  AST 13* 13*  ALT 13* 14*  ALKPHOS 85 91  BILITOT 0.6 0.6  PROT 6.7 7.1  ALBUMIN 3.6 3.5   No results for input(s): LIPASE, AMYLASE in the last 168 hours. No results for input(s): AMMONIA in the last 168 hours. CBC:  Recent Labs Lab 05/28/15 1813  WBC 6.0  HGB 11.4*  HCT 35.7*  MCV 81.9  PLT 146*   Cardiac Enzymes:  Recent Labs Lab 05/29/15 0022 05/29/15 0540 05/29/15 1200  TROPONINI 0.04* 0.05* 0.04*   BNP: Invalid input(s): POCBNP CBG: No results for input(s): GLUCAP in the last 168 hours.  No results found for this or any previous visit (from the past 240 hour(s)).   Scheduled Meds: . aspirin EC  81 mg Oral Daily  . atorvastatin  40 mg Oral q1800  . carvedilol  25 mg Oral BID WC  . heparin  5,000 Units Subcutaneous 3 times per day  . hydrALAZINE  25 mg Oral TID  . isosorbide mononitrate  30 mg Oral Daily  . polyethylene glycol  17 g Oral Daily

## 2015-05-30 NOTE — Progress Notes (Signed)
OT Cancellation Note  Patient Details Name: [name] MRN: [id number] DOB: 09/07/1936   Cancelled Treatment:    Reason Eval/Treat Not Completed: Patient at procedure or test/ unavailable.  Earlie RavelingStraub, Froilan Mclean L OTR/L [phone] 05/30/2015, 10:37 AM

## 2015-05-30 NOTE — Progress Notes (Signed)
Pt remained NPO for stress test. Awaiting transport for pt pick up to nuclear medicine. Procedure exprained to  Pt through son.

## 2015-05-30 NOTE — Progress Notes (Signed)
Talked with [name] about results of stress test and said from cardiac standpoint can go home tonight and follow up with them in office in 2 weeks. Called internal medicine MD, Dr. Elisabeth Fields for discharge orders. She wants to wait to discharge until tomorrow morning. Patient also needs to talk with case management regarding insurance, orange card, and follow up appointments. Both children at beside, updated with situation and will be here tomorrow to talk with MD and case management to get him what he needs before discharge. Will continue to monitor.  ,[name], [name]

## 2015-05-30 NOTE — Progress Notes (Signed)
Tolerated Lexiscan well, images pending.  L.Santiago Stenzel PA

## 2015-05-30 NOTE — Progress Notes (Signed)
Myoview low risk. See recommendations per Dr Delton SeeNelson. We will arrange for OP follow up in 2 weeks.  Corine ShelterLUKE Tarae Wooden PA-C 05/30/2015 4:44 PM

## 2015-05-30 NOTE — Progress Notes (Signed)
Patient Name: [name] Date of Encounter: 05/30/2015  Principal Problem:   SOB (shortness of breath) Active Problems:   AKI (acute kidney injury)   Bilateral leg edema   Essential hypertension   Aortic stenosis, moderate   Length of Stay: 2  SUBJECTIVE  No more chest pain this am, improved SOB.  CURRENT MEDS . aspirin EC  81 mg Oral Daily  . atorvastatin  40 mg Oral q1800  . carvedilol  25 mg Oral BID WC  . furosemide  20 mg Intravenous Daily  . heparin  5,000 Units Subcutaneous 3 times per day  . losartan  25 mg Oral Daily  . [START ON 05/31/2015] pneumococcal 23 valent vaccine  0.5 mL Intramuscular Tomorrow-1000  . polyethylene glycol  17 g Oral Daily  . sodium chloride  3 mL Intravenous Q12H    OBJECTIVE  Filed Vitals:   05/30/15 0930 05/30/15 0942 05/30/15 0944 05/30/15 0946  BP: 191/86 217/101 176/92 159/78  Pulse:      Temp:      TempSrc:      Resp: 22 32 20 22  Height:      Weight:      SpO2:        Intake/Output Summary (Last 24 hours) at 05/30/15 1052 Last data filed at 05/30/15 0926  Gross per 24 hour  Intake    240 ml  Output      0 ml  Net    240 ml   Filed Weights   05/28/15 2330 05/29/15 0541 05/30/15 0350  Weight: 138 lb 1.6 oz (62.642 kg) 137 lb 6.4 oz (62.324 kg) 134 lb 11.2 oz (61.1 kg)    PHYSICAL EXAM  General: Pleasant, NAD. Neuro: Alert and oriented X 3. Moves all extremities spontaneously. Psych: Normal affect. HEENT:  Normal  Neck: Supple without bruits or JVD. Lungs:  Resp regular and unlabored, CTA. Heart: RRR no s3, s4, 3/6 systolic murmur. Abdomen: Soft, non-tender, non-distended, BS + x 4.  Extremities: No clubbing, cyanosis or edema. DP/PT/Radials 2+ and equal bilaterally.  Accessory Clinical Findings  CBC  Recent Labs  05/28/15 1813  WBC 6.0  HGB 11.4*  HCT 35.7*  MCV 81.9  PLT 146*   Basic Metabolic Panel  Recent Labs  05/29/15 0540 05/30/15 0448  NA 140 139  K 3.5 3.6  CL 108 104  CO2 23 25    GLUCOSE 89 96  BUN 24* 26*  CREATININE 1.43* 1.58*  CALCIUM 9.2 8.8*  MG 2.0  --    Liver Function Tests  Recent Labs  05/28/15 1813 05/29/15 2201  AST 13* 13*  ALT 13* 14*  ALKPHOS 85 91  BILITOT 0.6 0.6  PROT 6.7 7.1  ALBUMIN 3.6 3.5   Cardiac Enzymes  Recent Labs  05/29/15 0022 05/29/15 0540 05/29/15 1200  TROPONINI 0.04* 0.05* 0.04*   Recent Labs  05/29/15 0540  HGBA1C 6.2*   Fasting Lipid Panel  Recent Labs  05/29/15 0540  CHOL 255*  HDL 43  LDLCALC 189*  TRIG 114  CHOLHDL 5.9   Thyroid Function Tests  Recent Labs  05/29/15 0540  TSH 3.411   Radiology/Studies  Dg Chest 2 View  05/28/2015   CLINICAL DATA:  Shortness of breath   IMPRESSION: No evidence of acute cardiopulmonary disease.     US Renal  05/29/2015   CLINICAL DATA:  Acute kidney injury.  Initial encounter.   IMPRESSION: 1. Both kidneys demonstrate mild cortical thinning and increased echogenicity consistent  with chronic medical renal disease. 2. No evidence of hydronephrosis.  Bilateral ureteral jets present. 3. Bilateral renal cysts.     TELE: SR  ECG: SR, significant LVH with repolarization abnormalities  TTE: 05/29/2015 - Left ventricle: The cavity size was normal. There was moderate concentric hypertrophy. Systolic function was vigorous. The estimated ejection fraction was in the range of 65% to 70%. Wall motion was normal; there were no regional wall motion abnormalities. Doppler parameters are consistent with abnormal left ventricular relaxation (grade 1 diastolic dysfunction). The E/e&' ratio is between 8-15, suggesting indeterminate LV filling pressure. - Aortic valve: Trileaflet; mildly calcified leaflets. Moderate aortic stenosis -peak and mean gradients of 45 and 26 mmHg, respectively. There was mild regurgitation. Valve area (VTI): 1.12 cm^2. Valve area (Vmax): 1.12 cm^2. Valve area (Vmean): 1.1 cm^2. - Mitral valve: Sclerotic AMVL. Mild MR.  Calcified annulus. - Left atrium: The atrium was normal in size. - Tricuspid valve: There was mild regurgitation. - Pulmonary arteries: PA peak pressure: 32 mm Hg (S). - Inferior vena cava: The vessel was normal in size. The respirophasic diameter changes were in the normal range (>= 50%), consistent with normal central venous pressure.  Impressions: - LVEF 65-70%, moderate LVH, diastolic dysfunction, moderate aortic stenosis - AVA around 1.1 cm2, mild AI, MR and TR, top normal RVSP.    ASSESSMENT AND PLAN  Principal Problem:  SOB (shortness of breath) Active Problems:  AKI (acute kidney injury)  Bilateral leg edema  Essential hypertension  Aortic stenosis, moderate  1. DOE/ Acute Diastolic CHF: likely subsequent to acute on chronic diastolic CHF and hypertensive urgency. 2D echo shows moderate concentric LVH, normal LV systolic function and grade 1 DD. EKG consistent with LHV.  We will follow results of a lexiscan stress test. I will discontinue losartan as he has acute on chronic CKD stage 3, discontinue lasix as he appears euvolemic. Start BiDil combination for BP control  2. Aortic Stenosis: moderate with AVR of 1.1 cm2 and mean gradient at 23 mgHg. Recommend yearly assessment by TTE.   3. Elevated Troponin: Mildly elevated with flat trend. Also in the setting of renal insufficiency. Likely secondary to demand ischemia in the setting of acute diastolic CHF and accelerated hypertension. However will plan for NST in the am to r/o ischemia.  4. AKI: SCr 1.43. Baseline is 1.1. Changes as above..   5. HLD: elevated LDL at 189 ml/dL. Recommend statin if normal hepatic function. Will need to check LFTs.   6. Accelerated HTN: initial BP 190s systolic. Changes as above.   Signed, Lars MassonNELSON, Palmina Clodfelter H MD, [facility] 05/30/2015

## 2015-05-31 LAB — BASIC METABOLIC PANEL
Anion gap: 9 (ref 5–15)
BUN: 24 mg/dL — AB (ref 6–20)
CALCIUM: 8.8 mg/dL — AB (ref 8.9–10.3)
CO2: 27 mmol/L (ref 22–32)
Chloride: 103 mmol/L (ref 101–111)
Creatinine, Ser: 1.64 mg/dL — ABNORMAL HIGH (ref 0.61–1.24)
GFR, EST AFRICAN AMERICAN: 45 mL/min — AB (ref >60)
GFR, EST NON AFRICAN AMERICAN: 39 mL/min — AB (ref >60)
Glucose, Bld: 103 mg/dL — ABNORMAL HIGH (ref 65–99)
Potassium: 4 mmol/L (ref 3.5–5.1)
Sodium: 139 mmol/L (ref 135–145)

## 2015-05-31 MED ORDER — ATORVASTATIN CALCIUM 40 MG PO TABS: 40.0000 mg | ORAL_TABLET | Freq: Every day | ORAL | Status: DC

## 2015-05-31 MED ORDER — CARVEDILOL 25 MG PO TABS: 25.0000 mg | ORAL_TABLET | Freq: Two times a day (BID) | ORAL | Status: DC

## 2015-05-31 MED ORDER — ISOSORBIDE MONONITRATE ER 30 MG PO TB24: 30.0000 mg | ORAL_TABLET | Freq: Every day | ORAL | Status: DC

## 2015-05-31 MED ORDER — HYDRALAZINE HCL 25 MG PO TABS: 25.0000 mg | ORAL_TABLET | Freq: Three times a day (TID) | ORAL | Status: DC

## 2015-05-31 MED ORDER — ASPIRIN 81 MG PO TBEC: 81.0000 mg | DELAYED_RELEASE_TABLET | Freq: Every day | ORAL | Status: DC

## 2015-05-31 NOTE — Discharge Instructions (Signed)

## 2015-05-31 NOTE — Progress Notes (Signed)
OT Cancellation Note  Patient Details Name: Cipriano MileMustafa Costantino MRN: [id number] DOB: 12/31/1935   Cancelled Treatment:    Reason Eval/Treat Not Completed: OT screened, no needs identified, will sign off. Per family, pt has no needs.  Nena JordanMiller, Amed Datta M   Carney LivingLeeAnn Marie Robbie Rideaux, OTR/L Occupational Therapist [phone] (pager)  05/31/2015, 9:06 AM

## 2015-05-31 NOTE — Discharge Summary (Signed)
Physician Discharge Summary  Carlos MileMustafa Fields ZOX:[id number] DOB: 05/20/1936 DOA: 05/28/2015  PCP: with [facility]  Admit date: 05/28/2015 Discharge date: 05/31/2015  Recommendations for Outpatient Follow-up:  1. New meds: aspirin, hydralazine, imdur, atorvastatin. Coreg dose increased.  2. Check Cr in PCP office to make sure it is stable   Discharge Diagnoses:  Principal Problem:   SOB (shortness of breath) Active Problems:   AKI (acute kidney injury)   Bilateral leg edema   Essential hypertension   Aortic stenosis, moderate   CKD (chronic kidney disease)   Acute diastolic CHF (congestive heart failure)   Troponin level elevated   Accelerated hypertension    Discharge Condition: stable   Diet recommendation: as tolerated   History of present illness:  79 y.o. male with PMH of hypertension who [facility] with worsening shortness of breath for past 24 hours prior to this admission. Pt reported shortness of breath is worse when he lays down and with exertion. Pt reported associated lower extremity swelling. No chest pain, no palpitations. No fevers or chills.  In ED, patient was found to have mild elevation in troponin level. His 12 lead EKG showed sinus rhythm. BNP was 415. Pt was seen by cardiology in consultation and he had a stress test done which did not show ischemia. He will be discharged home 6/2 so we can observe him after stress test. Case management to assist with medication needs.  Hospital Course:    Assessment/Plan:    Principal Problem: Dyspnea on exertion / Acute Diastolic CHF - BNP on this admission mildly elevated at 415.  - 2 D ECHO on this admission showed essentially preserved EF and grade 1 diastolic dysfunction - The 12 lead EKG on admission consistent with LVH - Continue spirin, statin therapy, coreg 25 mg PO BID, Imdur and hydralazine  - Losartan contraindicated due to acute on chronic kidney disease stage 3  Active Problems: Aortic Stenosis - Pt  has moderate AVR of 1.1 cm2 and mean gradient at 23 mgHg.  - Per cardio, recommend yearly assessment by TTE.   Elevated Troponin - Likely demand ischemia from acute diastolic CHF and CKD - Stress test done 6/1 with no ischemic changes - Continue meds as noted above   Acute on chronic kidney disease, stage 3 - Creatinine 1.45 on admission and this am 1.64 (baseline is 1.1) - Will be followed up outpt   Dyslipidemia - Continue statin therapy   Accelerated HTN - Continue imdur, hydralazine, coreg   DVT Prophylaxis  - Heparin subQ while pt in hospital    Code Status: Full.  Family Communication: Family not at the bedside this am    IV access:  Peripheral IV  Procedures and diagnostic studies:   Dg Chest 2 View 05/28/2015 No evidence of acute cardiopulmonary disease.   [place] Renal 05/29/2015 1. Both kidneys demonstrate mild cortical thinning and increased echogenicity consistent with chronic medical renal disease. 2. No evidence of hydronephrosis. Bilateral ureteral jets present. 3. Bilateral renal cysts. Electronically Signed By: Carey BullocksWilliam Veazey M.D. On: 05/29/2015 07:13   Nm Myocar Multi W/spect W/wall Motion / Ef 05/30/2015  There was no ST segment deviation noted during stress.  The study is normal.  This is a low risk study.  The left ventricular ejection fraction is low normal (53%). Low risk pharmacological stress nuclear study with normal perfusion and normal left ventricular regional motion and low normal global systolic function.    Medical Consultants:  Cardiology  Other Consultants:  None  IAnti-Infectives:   None    Signed:  Manson Passey, MD  [facility] 05/31/2015, 9:49 AM  Pager #: [phone]  Time spent in minutes: less than 30 minutes   Discharge Exam: Filed Vitals:   05/31/15 0426  BP: 141/67  Pulse: 68  Temp: 98.2 F (36.8 C)  Resp: 20   Filed Vitals:   05/30/15 1343 05/30/15 1721 05/30/15 2040  05/31/15 0426  BP: 134/64 160/74 129/65 141/67  Pulse: 72 76 66 68  Temp: 97.7 F (36.5 C)  97.9 F (36.6 C) 98.2 F (36.8 C)  TempSrc: Oral  Oral Oral  Resp: Height:      Weight:    62 kg (136 lb 11 oz)  SpO2: 100%  100% 100%    General: Pt is alert, follows commands appropriately, not in acute distress Cardiovascular: Regular rate and rhythm, S1/S2 +, no murmurs Respiratory: Clear to auscultation bilaterally, no wheezing, no crackles, no rhonchi Abdominal: Soft, non tender, non distended, bowel sounds +, no guarding Extremities: no edema, no cyanosis, pulses palpable bilaterally DP and PT Neuro: Grossly nonfocal  Discharge Instructions  Discharge Instructions    Call MD for:  difficulty breathing, headache or visual disturbances    Complete by:  As directed      Call MD for:  persistant dizziness or light-headedness    Complete by:  As directed      Call MD for:  persistant nausea and vomiting    Complete by:  As directed      Call MD for:  severe uncontrolled pain    Complete by:  As directed      Diet - low sodium heart healthy    Complete by:  As directed      Discharge instructions    Complete by:  As directed   1. Follow up with cardio per scheduled appointment 2. Please note new medications: aspirin, lipitor, coreg (dose increased), hydralazine, imdur) 3. Check Cr in PCP in next 1 week to make sure stable      Increase activity slowly    Complete by:  As directed             Medication List    TAKE these medications        aspirin 81 MG EC tablet  Take 1 tablet (81 mg total) by mouth daily.     atorvastatin 40 MG tablet  Commonly known as:  LIPITOR  Take 1 tablet (40 mg total) by mouth daily at 6 PM.     carvedilol 25 MG tablet  Commonly known as:  COREG  Take 1 tablet (25 mg total) by mouth 2 (two) times daily with a meal.     hydrALAZINE 25 MG tablet  Commonly known as:  APRESOLINE  Take 1 tablet (25 mg total) by mouth 3 (three) times  daily.     isosorbide mononitrate 30 MG 24 hr tablet  Commonly known as:  IMDUR  Take 1 tablet (30 mg total) by mouth daily.           Follow-up Information    Follow up with  [facility]    .   Contact information:   [address] [place] [address] [phone]       The results of significant diagnostics from this hospitalization (including imaging, microbiology, ancillary and laboratory) are listed below for reference.    Significant Diagnostic Studies: Dg Chest 2 View  05/28/2015  CLINICAL DATA:  Shortness of breath  EXAM: CHEST  2 VIEW  COMPARISON:  CTA chest dated 01/19/2009  FINDINGS: Lungs are clear.  No pleural effusion or pneumothorax.  The heart is top-normal in size.  Mild degenerative changes of the visualized thoracolumbar spine.  IMPRESSION: No evidence of acute cardiopulmonary disease.   Electronically Signed   By: Charline Bills M.D.   On: 05/28/2015 17:21   US Renal  05/29/2015   CLINICAL DATA:  Acute kidney injury.  Initial encounter.  EXAM: RENAL / URINARY TRACT ULTRASOUND COMPLETE  COMPARISON:  Limited correlation made with chest CT 11/19/2009.  FINDINGS: Right Kidney:  Length: 9.7 cm. Mild renal cortical thinning and increased echogenicity. There is a large cyst in the upper pole measuring 6.1 x 5.3 x 5.1 cm. There is an adjacent smaller cyst measuring 1.5 cm maximally. No hydronephrosis.  Left Kidney:  Length: 8.6 cm. Mild renal cortical thinning and increased echogenicity there is a small probable cyst in the lower interpolar region measuring 1.2 cm maximally. No hydronephrosis.  Bladder:  Appears normal for degree of bladder distention. Bilateral ureteral jets noted. The prostate gland is mildly enlarged, measure up to 3.1 cm.  IMPRESSION: 1. Both kidneys demonstrate mild cortical thinning and increased echogenicity consistent with chronic medical renal disease. 2. No evidence of hydronephrosis.  Bilateral  ureteral jets present. 3. Bilateral renal cysts.   Electronically Signed   By: Carey Bullocks M.D.   On: 05/29/2015 07:13   Nm Myocar Multi W/spect W/wall Motion / Ef  05/30/2015    There was no ST segment deviation noted during stress.  The study is normal.  This is a low risk study.  The left ventricular ejection fraction is low normal (53%).   Low risk pharmacological stress nuclear study with normal perfusion and  normal left ventricular regional motion and low normal global systolic  function.     Microbiology: No results found for this or any previous visit (from the past 240 hour(s)).   Labs: Basic Metabolic Panel:  Recent Labs Lab 05/28/15 1813 05/29/15 0540 05/30/15 0448 05/31/15 0530  NA 141 140 139 139  K 4.1 3.5 3.6 4.0  CL 112* 108 104 103  CO2 GLUCOSE 90 89 96 103*  BUN 22* 24* 26* 24*  CREATININE 1.45* 1.43* 1.58* 1.64*  CALCIUM 9.2 9.2 8.8* 8.8*  MG  --  2.0  --   --    Liver Function Tests:  Recent Labs Lab 05/28/15 1813 05/29/15 2201  AST 13* 13*  ALT 13* 14*  ALKPHOS 85 91  BILITOT 0.6 0.6  PROT 6.7 7.1  ALBUMIN 3.6 3.5   No results for input(s): LIPASE, AMYLASE in the last 168 hours. No results for input(s): AMMONIA in the last 168 hours. CBC:  Recent Labs Lab 05/28/15 1813  WBC 6.0  HGB 11.4*  HCT 35.7*  MCV 81.9  PLT 146*   Cardiac Enzymes:  Recent Labs Lab 05/29/15 0022 05/29/15 0540 05/29/15 1200  TROPONINI 0.04* 0.05* 0.04*   BNP: BNP (last 3 results)  Recent Labs  05/28/15 1813  BNP 415.5*    ProBNP (last 3 results) No results for input(s): PROBNP in the last 8760 hours.  CBG: No results for input(s): GLUCAP in the last 168 hours.

## 2015-05-31 NOTE — Care Management Note (Signed)
Case Management Note  Patient Details  Name: Cipriano MileMustafa Jurgens MRN: [id number] Date of Birth: 10/22/1936  Subjective/Objective:         AKI           Action/Plan:   Expected Discharge Date:   05/31/2015               Expected Discharge Plan:  Home/Self Care  In-House Referral:     Discharge planning Services  CM Consult, [facility]   Status of Service:  Completed, signed off  Medicare Important Message Given:  No Date Medicare IM Given:    Medicare IM give by:    Date Additional Medicare IM Given:    Additional Medicare Important Message give by:     If discussed at Long Length of Stay Meetings, dates discussed:    Additional Comments: NCM spoke to pt and gave permission to speak to son, Dorita SciaraOsama Sedahmed [phone]. Provided son with brochure for [facility] with appt time 06/07/2015 at 2 pm. [facility]. Pt has [facility] orange card that will 03/2015-09/2015.  Elliot CousinShavis, Tupac Jeffus Ellen, RN 05/31/2015, 11:36 AM

## 2015-06-04 ENCOUNTER — Telehealth: Payer: Self-pay | Admitting: Cardiology

## 2015-06-04 NOTE — Telephone Encounter (Signed)
Spoke with [name] regarding post hospital visit scheduled for 07/04/15 @ 9:30am [facility].  He voiced his understanding.

## 2015-06-07 ENCOUNTER — Ambulatory Visit: Payer: No Typology Code available for payment source | Attending: Internal Medicine | Admitting: Physician Assistant

## 2015-06-07 ENCOUNTER — Encounter: Payer: Self-pay | Admitting: Physician Assistant

## 2015-06-07 ENCOUNTER — Ambulatory Visit (HOSPITAL_COMMUNITY)
Admission: RE | Admit: 2015-06-07 | Discharge: 2015-06-07 | Disposition: A | Discharge status: Home / Self Care | Payer: No Typology Code available for payment source | Source: Ambulatory Visit | Attending: Physician Assistant | Admitting: Physician Assistant

## 2015-06-07 VITALS — BP 134/66 | HR 64 | Temp 98.4°F | Resp 18 | Ht 64.0 in | Wt 139.4 lb

## 2015-06-07 DIAGNOSIS — M2011 Hallux valgus (acquired), right foot: Secondary | ICD-10-CM | POA: Insufficient documentation

## 2015-06-07 DIAGNOSIS — M85871 Other specified disorders of bone density and structure, right ankle and foot: Secondary | ICD-10-CM | POA: Insufficient documentation

## 2015-06-07 DIAGNOSIS — M79671 Pain in right foot: Secondary | ICD-10-CM

## 2015-06-07 LAB — URIC ACID: URIC ACID, SERUM: 9.9 mg/dL — AB (ref 4.0–7.8)

## 2015-06-07 LAB — BASIC METABOLIC PANEL
BUN: 21 mg/dL (ref 6–23)
CO2: 25 mEq/L (ref 19–32)
Calcium: 8.9 mg/dL (ref 8.4–10.5)
Chloride: 107 mEq/L (ref 96–112)
Creat: 1.59 mg/dL — ABNORMAL HIGH (ref 0.50–1.35)
GLUCOSE: 99 mg/dL (ref 70–99)
Potassium: 4.6 mEq/L (ref 3.5–5.3)
Sodium: 140 mEq/L (ref 135–145)

## 2015-06-07 MED ORDER — COLCHICINE 0.6 MG PO TABS: 0.6000 mg | ORAL_TABLET | Freq: Two times a day (BID) | ORAL | Status: DC

## 2015-06-07 NOTE — Progress Notes (Signed)
Patient here for swelling in right foot, possibly from gout.  Patient was recently hospitalized for SOB/CHF/AKI.  Patient complains of right foot pain and headache.  Right foot pain 8/10 described as sharp.  Headache 4/10 on both sides as aching.  Patient never smoker.

## 2015-06-07 NOTE — Progress Notes (Signed)
Trelon Plush  ZOX:[id number]  [id number]:[id number]  DOB - [birth year]  Chief Complaint  Patient presents with  . Hospitalization Follow-up  . Establish Care       Subjective:   [name] is a 79 y.o. male here today for establishment of care.  He was hospitalized 05/28/2015 through 05/31/2015 with complaints of shortness of breath initially. He states he had been orthopneic and having lower extremity edema as well as shortness of breath for 24 hours prior to his admission. His troponin in the emergency department with borderline. His blood pressure systolically was greater than 190. His EKG was okay. His creatinine was 1.4. He was seen by cardiology and an echo showed 1 out of 4 diastolic dysfunction. He had preserved LV function. He had moderate aortic stenosis. He underwent stress testing which was negative for ischemia.  He was diuresed which causes creatinine did bump to 1.6.  Otherwise he had an uncomplicated hospital course.  Since discharge  He has had complaints of swelling in his right foot. He did hit his foot in the house and is not sure if he broke it. He also has a history of gouty arthritis on 2-3 separate occasions. He describes it 8 on a scale of 1-10 sharp pain and difficulty bearing weight on the right leg.   ROS:GEN: denies fever or chills, denies change in weight LUNGS: denies SHOB, dyspnea, PND, orthopnea CV: denies CP or palpitations EXT: denies muscle spasms or swelling; + pain in right lower ext, no weakness NEURO: denies numbness or tingling, denies sz, stroke or TIA  ALLERGIES: No Known Allergies  PAST MEDICAL HISTORY: Past Medical History  Diagnosis Date  . Hypertension   . Gouty arthritis of toe of right foot   . GERD (gastroesophageal reflux disease)   . Shortness of breath dyspnea   . BPH (benign prostatic hyperplasia)     PAST SURGICAL HISTORY: Past Surgical History  Procedure Laterality Date  . Prostatectomy      MEDICATIONS AT HOME: Prior to  Admission medications   Medication Sig Start Date End Date Taking? Authorizing Provider  aspirin EC 81 MG EC tablet Take 1 tablet (81 mg total) by mouth daily. 05/31/15  Yes Alison Murray, MD  atorvastatin (LIPITOR) 40 MG tablet Take 1 tablet (40 mg total) by mouth daily at 6 PM. 05/31/15  Yes Alison Murray, MD  carvedilol (COREG) 25 MG tablet Take 1 tablet (25 mg total) by mouth 2 (two) times daily with a meal. 05/31/15  Yes Alison Murray, MD  hydrALAZINE (APRESOLINE) 25 MG tablet Take 1 tablet (25 mg total) by mouth 3 (three) times daily. 05/31/15  Yes Alison Murray, MD  isosorbide mononitrate (IMDUR) 30 MG 24 hr tablet Take 1 tablet (30 mg total) by mouth daily. 05/31/15  Yes Alison Murray, MD  colchicine 0.6 MG tablet Take 1 tablet (0.6 mg total) by mouth 2 (two) times daily. 06/07/15   Vivianne Master, PA-C     Objective:   Filed Vitals:   06/07/15 1409  BP: 134/66  Pulse: 64  Temp: 98.4 F (36.9 C)  TempSrc: Oral  Resp: 18  Height:  (1.626 m)  Weight: 139 lb 6.4 oz (63.231 kg)  SpO2: 97%    Exam General appearance : Awake, alert, not in any distress. Speech Clear. Not toxic looking HEENT: Atraumatic and Normocephalic, pupils equally reactive to light and accomodation Neck: supple, no JVD. No cervical lymphadenopathy.  Chest:Good air entry bilaterally, no  added sounds  CVS: S1 S2 regular, no murmurs.  Extremities: B/L Lower Ext shows no edema,  His right foot is swollen however and sensitive to palpation; both legs are warm to touch   Data Review Lab Results  Component Value Date   HGBA1C 6.2* 05/29/2015     Assessment & Plan  1. Acute dHF exacerbation-compensated  -Cont Coreg, hydral/imdur  -lifestyle modification (meds, periodic weight checks, diet)  -BP control 2. HTN urgency-resolved  -Cont Coreg, Hydral/Imdur  -dietary, low sodium diet 3. Moderate AS  -BP control  -annual echos 4. Right foot pain r/o fracture vs gout  -foot xray  -uric acid  level  -Colchicine 5. Acute on [place] kidney dz stage 3  -BMP today     Return in about 3 weeks (around 06/28/2015). w/ Holland Commons, NP for follow up  The patient was given clear instructions to go to ER or return to medical center if symptoms don't improve, worsen or new problems develop. The patient verbalized understanding. The patient was told to call to get lab results if they haven't heard anything in the next week.   This note has been created with Education officer, environmental. Any transcriptional errors are unintentional.    Scot Jun, PA-C [facility] [place], [place] [phone]   06/07/2015, 2:39 PM

## 2015-06-19 ENCOUNTER — Telehealth: Payer: Self-pay

## 2015-06-19 NOTE — Telephone Encounter (Signed)
Nurse called patient via [employer], Interpreter [phone]. Interpreter left messages on both numbers listed in chart for patient to return call to [place] at [facility], [phone].

## 2015-06-19 NOTE — Telephone Encounter (Signed)
-----   Message from Vivianne Master, [place] sent at 06/11/2015  3:56 PM EDT ----- Tobi Bastos,  Can you call this pt or his son and let them know that the xray did not show a fracture/break but did confirm gout. Keep taking the medication as prescribed. Thanks   ----- Message -----    From: Rad Results In Interface    Sent: 06/07/2015   4:16 PM      To: Vivianne Master, PA-C

## 2015-06-20 ENCOUNTER — Encounter: Payer: Self-pay | Admitting: Internal Medicine

## 2015-06-20 ENCOUNTER — Ambulatory Visit: Payer: No Typology Code available for payment source | Attending: Internal Medicine | Admitting: Internal Medicine

## 2015-06-20 ENCOUNTER — Telehealth: Payer: Self-pay | Admitting: Internal Medicine

## 2015-06-20 VITALS — BP 150/70 | HR 66 | Temp 98.0°F | Resp 16 | Wt 137.0 lb

## 2015-06-20 DIAGNOSIS — I10 Essential (primary) hypertension: Secondary | ICD-10-CM

## 2015-06-20 DIAGNOSIS — Z7982 Long term (current) use of aspirin: Secondary | ICD-10-CM | POA: Insufficient documentation

## 2015-06-20 DIAGNOSIS — E785 Hyperlipidemia, unspecified: Secondary | ICD-10-CM

## 2015-06-20 DIAGNOSIS — M79671 Pain in right foot: Secondary | ICD-10-CM

## 2015-06-20 DIAGNOSIS — N400 Enlarged prostate without lower urinary tract symptoms: Secondary | ICD-10-CM | POA: Insufficient documentation

## 2015-06-20 DIAGNOSIS — M109 Gout, unspecified: Secondary | ICD-10-CM

## 2015-06-20 DIAGNOSIS — I509 Heart failure, unspecified: Secondary | ICD-10-CM | POA: Insufficient documentation

## 2015-06-20 DIAGNOSIS — Z8679 Personal history of other diseases of the circulatory system: Secondary | ICD-10-CM

## 2015-06-20 DIAGNOSIS — M10071 Idiopathic gout, right ankle and foot: Secondary | ICD-10-CM | POA: Insufficient documentation

## 2015-06-20 MED ORDER — ALLOPURINOL 100 MG PO TABS: 100.0000 mg | ORAL_TABLET | Freq: Every day | ORAL | Status: DC

## 2015-06-20 NOTE — Progress Notes (Signed)
MRN: [id number] Name: [name]  Sex: male Age: 79 y.o. DOB: 1936-10-09  Allergies: Review of patient's allergies indicates no known allergies.  Chief Complaint  Patient presents with  . Follow-up    HPI: Patient is 79 y.o. male who has history of diastolic CHF, aortic stenosis, renal insufficiency, was seen by Elmarie Shiley is a hospital followup a few weeks ago at that time patient had right foot pain, he was prescribed colchicine, had a blood work done which was reviewed with the patient noticed elevated uric acid level also his x-ray findings are consistent with gout, patient has never been on Trental medication he reports improvement in the symptoms after taking colchicine, patient denies drinking alcohol, he has been advised for low protein diet, his blood pressure is borderline elevated today as per patient he has not taking all of his blood pressure medications today. Denies any chest and shortness of breath.  Past Medical History  Diagnosis Date  . Hypertension   . Gouty arthritis of toe of right foot   . GERD (gastroesophageal reflux disease)   . Shortness of breath dyspnea   . BPH (benign prostatic hyperplasia)     Past Surgical History  Procedure Laterality Date  . Prostatectomy        Medication List       This list is accurate as of: 06/20/15  4:34 PM.  Always use your most recent med list.               allopurinol 100 MG tablet  Commonly known as:  ZYLOPRIM  Take 1 tablet (100 mg total) by mouth daily.     aspirin 81 MG EC tablet  Take 1 tablet (81 mg total) by mouth daily.     atorvastatin 40 MG tablet  Commonly known as:  LIPITOR  Take 1 tablet (40 mg total) by mouth daily at 6 PM.     carvedilol 25 MG tablet  Commonly known as:  COREG  Take 1 tablet (25 mg total) by mouth 2 (two) times daily with a meal.     colchicine 0.6 MG tablet  Take 1 tablet (0.6 mg total) by mouth 2 (two) times daily.     hydrALAZINE 25 MG tablet  Commonly known as:   APRESOLINE  Take 1 tablet (25 mg total) by mouth 3 (three) times daily.     isosorbide mononitrate 30 MG 24 hr tablet  Commonly known as:  IMDUR  Take 1 tablet (30 mg total) by mouth daily.        Meds ordered this encounter  Medications  . allopurinol (ZYLOPRIM) 100 MG tablet    Sig: Take 1 tablet (100 mg total) by mouth daily.    Dispense:  30 tablet    Refill:  6    Immunization History  Administered Date(s) Administered  . Pneumococcal Polysaccharide-23 05/31/2015    Family History  Problem Relation Age of Onset  . Hypertension Father   . Hypertension Sister   . Hypertension Brother     History  Substance Use Topics  . Smoking status: Never Smoker   . Smokeless tobacco: Not on file  . Alcohol Use: No    Review of Systems   As noted in HPI  Filed Vitals:   06/20/15 1625  BP: 150/70  Pulse:   Temp:   Resp:     Physical Exam  Physical Exam  Constitutional: No distress.  Eyes: EOM are normal. Pupils are equal, round, and reactive to  light.  Cardiovascular: Normal rate and regular rhythm.   Pulmonary/Chest: Breath sounds normal. No respiratory distress. He has no wheezes. He has no rales.  Musculoskeletal:  Right foot big toe minimal swelling, no tenderness, 2+ dorsalis pedis pulse.    CBC    Component Value Date/Time   WBC 6.0 05/28/2015 1813   RBC 4.36 05/28/2015 1813   HGB 11.4* 05/28/2015 1813   HCT 35.7* 05/28/2015 1813   PLT 146* 05/28/2015 1813   MCV 81.9 05/28/2015 1813   LYMPHSABS 2.4 11/19/2009 2140   MONOABS 0.6 11/19/2009 2140   EOSABS 0.0 11/19/2009 2140   BASOSABS 0.0 11/19/2009 2140    CMP     Component Value Date/Time   NA 140 06/07/2015 1440   K 4.6 06/07/2015 1440   CL 107 06/07/2015 1440   CO2 25 06/07/2015 1440   GLUCOSE 99 06/07/2015 1440   BUN 21 06/07/2015 1440   CREATININE 1.59* 06/07/2015 1440   CREATININE 1.64* 05/31/2015 0530   CALCIUM 8.9 06/07/2015 1440   PROT 7.1 05/29/2015 2201   ALBUMIN 3.5  05/29/2015 2201   AST 13* 05/29/2015 2201   ALT 14* 05/29/2015 2201   ALKPHOS 91 05/29/2015 2201   BILITOT 0.6 05/29/2015 2201   GFRNONAA 39* 05/31/2015 0530   GFRAA 45* 05/31/2015 0530    Lab Results  Component Value Date/Time   CHOL 255* 05/29/2015 05:40 AM    Lab Results  Component Value Date/Time   HGBA1C 6.2* 05/29/2015 05:40 AM    Lab Results  Component Value Date/Time   AST 13* 05/29/2015 10:01 PM    Assessment and Plan  Essential hypertension/History of CHF (congestive heart failure) Blood pressure is borderline elevated, advise patient for DASH diet,continue with hydralazine, Coreg, aspirin, imdur , recheck blood chemistry On the following visit.   Right foot pain  Right foot pain/Gout of right foot, unspecified cause, unspecified chronicity - Plan: patient is taking colchicine and reports improvement, he is advised for low protein diet, will start him on allopurinol (ZYLOPRIM) 100 MG tablet  Hyperlipemia Currently patient is on Lipitor, will check fasting lipid panel on the next visit.   Return in about 3 months (around 09/20/2015).   This note has been created with Education officer, environmental. Any transcriptional errors are unintentional.    Doris Cheadle, MD

## 2015-06-20 NOTE — Patient Instructions (Addendum)
DASH Eating Plan DASH stands for "Dietary Approaches to Stop Hypertension." The DASH eating plan is a healthy eating plan that has been shown to reduce high blood pressure (hypertension). Additional health benefits may include reducing the risk of type 2 diabetes mellitus, heart disease, and stroke. The DASH eating plan may also help with weight loss. WHAT DO I NEED TO KNOW ABOUT THE DASH EATING PLAN? For the DASH eating plan, you will follow these general guidelines:  Choose foods with a percent daily value for sodium of less than 5% (as listed on the food label).  Use salt-free seasonings or herbs instead of table salt or sea salt.  Check with your health care provider or pharmacist before using salt substitutes.  Eat lower-sodium products, often labeled as "lower sodium" or "no salt added."  Eat fresh foods.  Eat more vegetables, fruits, and low-fat dairy products.  Choose whole grains. Look for the word "whole" as the first word in the ingredient list.  Choose fish and skinless chicken or turkey more often than red meat. Limit fish, poultry, and meat to 6 oz (170 g) each day.  Limit sweets, desserts, sugars, and sugary drinks.  Choose heart-healthy fats.  Limit cheese to 1 oz (28 g) per day.  Eat more home-cooked food and less restaurant, buffet, and fast food.  Limit fried foods.  Cook foods using methods other than frying.  Limit canned vegetables. If you do use them, rinse them well to decrease the sodium.  When eating at a restaurant, ask that your food be prepared with less salt, or no salt if possible. WHAT FOODS CAN I EAT? Seek help from a dietitian for individual calorie needs. Grains Whole grain or whole wheat bread. Brown rice. Whole grain or whole wheat pasta. Quinoa, bulgur, and whole grain cereals. Low-sodium cereals. Corn or whole wheat flour tortillas. Whole grain cornbread. Whole grain crackers. Low-sodium crackers. Vegetables Fresh or frozen vegetables  (raw, steamed, roasted, or grilled). Low-sodium or reduced-sodium tomato and vegetable juices. Low-sodium or reduced-sodium tomato sauce and paste. Low-sodium or reduced-sodium canned vegetables.  Fruits All fresh, canned (in natural juice), or frozen fruits. Meat and Other Protein Products Ground beef (85% or leaner), grass-fed beef, or beef trimmed of fat. Skinless chicken or turkey. Ground chicken or turkey. Pork trimmed of fat. All fish and seafood. Eggs. Dried beans, peas, or lentils. Unsalted nuts and seeds. Unsalted canned beans. Dairy Low-fat dairy products, such as skim or 1% milk, 2% or reduced-fat cheeses, low-fat ricotta or cottage cheese, or plain low-fat yogurt. Low-sodium or reduced-sodium cheeses. Fats and Oils Tub margarines without trans fats. Light or reduced-fat mayonnaise and salad dressings (reduced sodium). Avocado. Safflower, olive, or canola oils. Natural peanut or almond butter. Other Unsalted popcorn and pretzels. The items listed above may not be a complete list of recommended foods or beverages. Contact your dietitian for more options. WHAT FOODS ARE NOT RECOMMENDED? Grains White bread. White pasta. White rice. Refined cornbread. Bagels and croissants. Crackers that contain trans fat. Vegetables Creamed or fried vegetables. Vegetables in a cheese sauce. Regular canned vegetables. Regular canned tomato sauce and paste. Regular tomato and vegetable juices. Fruits Dried fruits. Canned fruit in light or heavy syrup. Fruit juice. Meat and Other Protein Products Fatty cuts of meat. Ribs, chicken wings, bacon, sausage, bologna, salami, chitterlings, fatback, hot dogs, bratwurst, and packaged luncheon meats. Salted nuts and seeds. Canned beans with salt. Dairy Whole or 2% milk, cream, half-and-half, and cream cheese. Whole-fat or sweetened yogurt. Full-fat   cheeses or blue cheese. Nondairy creamers and whipped toppings. Processed cheese, cheese spreads, or cheese  curds. Condiments Onion and garlic salt, seasoned salt, table salt, and sea salt. Canned and packaged gravies. Worcestershire sauce. Tartar sauce. Barbecue sauce. Teriyaki sauce. Soy sauce, including reduced sodium. Steak sauce. Fish sauce. Oyster sauce. Cocktail sauce. Horseradish. Ketchup and mustard. Meat flavorings and tenderizers. Bouillon cubes. Hot sauce. Tabasco sauce. Marinades. Taco seasonings. Relishes. Fats and Oils Butter, stick margarine, lard, shortening, ghee, and bacon fat. Coconut, palm kernel, or palm oils. Regular salad dressings. Other Pickles and olives. Salted popcorn and pretzels. The items listed above may not be a complete list of foods and beverages to avoid. Contact your dietitian for more information. WHERE CAN I FIND MORE INFORMATION? National Heart, Lung, and [employer] Institute: www.nhlbi.nih.gov/health/health-topics/topics/dash/ Document Released: 12/04/2011 Document Revised: 05/01/2014 Document Reviewed: 10/19/2013 ExitCare Patient Information 2015 [employer]. This information is not intended to replace advice given to you by your health care provider. Make sure you discuss any questions you have with your health care provider. Gout Gout is an inflammatory arthritis caused by a buildup of uric acid crystals in the joints. Uric acid is a chemical that is normally present in the blood. When the level of uric acid in the blood is too high it can form crystals that deposit in your joints and tissues. This causes joint redness, soreness, and swelling (inflammation). Repeat attacks are common. Over time, uric acid crystals can form into masses (tophi) near a joint, destroying bone and causing disfigurement. Gout is treatable and often preventable. CAUSES  The disease begins with elevated levels of uric acid in the blood. Uric acid is produced by your body when it breaks down a naturally found substance called purines. Certain foods you eat, such as meats and fish, contain  high amounts of purines. Causes of an elevated uric acid level include:  Being passed down from parent to child (heredity).  Diseases that cause increased uric acid production (such as obesity, psoriasis, and certain cancers).  Excessive alcohol use.  Diet, especially diets rich in meat and seafood.  Medicines, including certain cancer-fighting medicines (chemotherapy), water pills (diuretics), and aspirin.  Chronic kidney disease. The kidneys are no longer able to remove uric acid well.  Problems with metabolism. Conditions strongly associated with gout include:  Obesity.  High blood pressure.  High cholesterol.  Diabetes. Not everyone with elevated uric acid levels gets gout. It is not understood why some people get gout and others do not. Surgery, joint injury, and eating too much of certain foods are some of the factors that can lead to gout attacks. SYMPTOMS   An attack of gout comes on quickly. It causes intense pain with redness, swelling, and warmth in a joint.  Fever can occur.  Often, only one joint is involved. Certain joints are more commonly involved:  Base of the big toe.  Knee.  Ankle.  Wrist.  Finger. Without treatment, an attack usually goes away in a few days to weeks. Between attacks, you usually will not have symptoms, which is different from many other forms of arthritis. DIAGNOSIS  Your caregiver will suspect gout based on your symptoms and exam. In some cases, tests may be recommended. The tests may include:  Blood tests.  Urine tests.  X-rays.  Joint fluid exam. This exam requires a needle to remove fluid from the joint (arthrocentesis). Using a microscope, gout is confirmed when uric acid crystals are seen in the joint fluid. TREATMENT  There   are two phases to gout treatment: treating the sudden onset (acute) attack and preventing attacks (prophylaxis).  Treatment of an Acute Attack.  Medicines are used. These include  anti-inflammatory medicines or steroid medicines.  An injection of steroid medicine into the affected joint is sometimes necessary.  The painful joint is rested. Movement can worsen the arthritis.  You may use warm or cold treatments on painful joints, depending which works best for you.  Treatment to Prevent Attacks.  If you suffer from frequent gout attacks, your caregiver may advise preventive medicine. These medicines are started after the acute attack subsides. These medicines either help your kidneys eliminate uric acid from your body or decrease your uric acid production. You may need to stay on these medicines for a very long time.  The early phase of treatment with preventive medicine can be associated with an increase in acute gout attacks. For this reason, during the first few months of treatment, your caregiver may also advise you to take medicines usually used for acute gout treatment. Be sure you understand your caregiver's directions. Your caregiver may make several adjustments to your medicine dose before these medicines are effective.  Discuss dietary treatment with your caregiver or dietitian. Alcohol and drinks high in sugar and fructose and foods such as meat, poultry, and seafood can increase uric acid levels. Your caregiver or dietitian can advise you on drinks and foods that should be limited. HOME CARE INSTRUCTIONS   Do not take aspirin to relieve pain. This raises uric acid levels.  Only take over-the-counter or prescription medicines for pain, discomfort, or fever as directed by your caregiver.  Rest the joint as much as possible. When in bed, keep sheets and blankets off painful areas.  Keep the affected joint raised (elevated).  Apply warm or cold treatments to painful joints. Use of warm or cold treatments depends on which works best for you.  Use crutches if the painful joint is in your leg.  Drink enough fluids to keep your urine clear or pale yellow. This  helps your body get rid of uric acid. Limit alcohol, sugary drinks, and fructose drinks.  Follow your dietary instructions. Pay careful attention to the amount of protein you eat. Your daily diet should emphasize fruits, vegetables, whole grains, and fat-free or low-fat milk products. Discuss the use of coffee, vitamin C, and cherries with your caregiver or dietitian. These may be helpful in lowering uric acid levels.  Maintain a healthy body weight. SEEK MEDICAL CARE IF:   You develop diarrhea, vomiting, or any side effects from medicines.  You do not feel better in 24 hours, or you are getting worse. SEEK IMMEDIATE MEDICAL CARE IF:   Your joint becomes suddenly more tender, and you have chills or a fever. MAKE SURE YOU:   Understand these instructions.  Will watch your condition.  Will get help right away if you are not doing well or get worse. Document Released: 12/12/2000 Document Revised: 05/01/2014 Document Reviewed: 07/28/2012 ExitCare Patient Information 2015 [employer]. This information is not intended to replace advice given to you by your health care provider. Make sure you discuss any questions you have with your health care provider.  

## 2015-06-20 NOTE — Telephone Encounter (Signed)
Patient is requesting script be re-written under PCP name and given a 3 month supply; please f/u with patient about this request

## 2015-06-20 NOTE — Progress Notes (Signed)
Patient is here for follow up on his hypertension And foot pain.  Also would like x ray results and lab results

## 2015-06-28 ENCOUNTER — Ambulatory Visit: Payer: No Typology Code available for payment source | Admitting: Internal Medicine

## 2015-07-04 ENCOUNTER — Ambulatory Visit: Payer: No Typology Code available for payment source | Admitting: Cardiology

## 2015-07-04 ENCOUNTER — Telehealth: Payer: Self-pay | Admitting: Internal Medicine

## 2015-07-04 NOTE — Telephone Encounter (Signed)
Patient has come up to the clinic to check status of medication refill request 6/22; Patient is asking that all script be re written under PCP name so that they may receive refills going forward; please f/u with last note and patient for clarification

## 2015-07-20 MED ORDER — ISOSORBIDE MONONITRATE ER 30 MG PO TB24
30.0000 mg | ORAL_TABLET | Freq: Every day | ORAL | Status: DC
Start: 2015-07-20 — End: 2015-08-22

## 2015-07-20 MED ORDER — CARVEDILOL 25 MG PO TABS: 25.0000 mg | ORAL_TABLET | Freq: Two times a day (BID) | ORAL | Status: DC

## 2015-07-20 MED ORDER — COLCHICINE 0.6 MG PO TABS: 0.6000 mg | ORAL_TABLET | Freq: Two times a day (BID) | ORAL | Status: AC

## 2015-07-20 MED ORDER — ATORVASTATIN CALCIUM 40 MG PO TABS: 40.0000 mg | ORAL_TABLET | Freq: Every day | ORAL | Status: DC

## 2015-07-20 MED ORDER — ASPIRIN 81 MG PO TBEC: 81.0000 mg | DELAYED_RELEASE_TABLET | Freq: Every day | ORAL | Status: DC

## 2015-07-20 MED ORDER — HYDRALAZINE HCL 25 MG PO TABS: 25.0000 mg | ORAL_TABLET | Freq: Three times a day (TID) | ORAL | Status: DC

## 2015-07-30 ENCOUNTER — Ambulatory Visit: Payer: No Typology Code available for payment source | Attending: Internal Medicine

## 2015-07-30 ENCOUNTER — Ambulatory Visit (HOSPITAL_COMMUNITY)
Admission: RE | Admit: 2015-07-30 | Discharge: 2015-07-30 | Disposition: A | Discharge status: Home / Self Care | Payer: No Typology Code available for payment source | Source: Ambulatory Visit | Attending: Internal Medicine | Admitting: Internal Medicine

## 2015-07-30 ENCOUNTER — Encounter: Payer: Self-pay | Admitting: Internal Medicine

## 2015-07-30 ENCOUNTER — Ambulatory Visit: Payer: No Typology Code available for payment source | Attending: Internal Medicine | Admitting: Internal Medicine

## 2015-07-30 VITALS — BP 132/56 | HR 73 | Temp 98.0°F | Resp 16 | Wt 142.0 lb

## 2015-07-30 DIAGNOSIS — J189 Pneumonia, unspecified organism: Secondary | ICD-10-CM | POA: Insufficient documentation

## 2015-07-30 DIAGNOSIS — I517 Cardiomegaly: Secondary | ICD-10-CM | POA: Insufficient documentation

## 2015-07-30 DIAGNOSIS — R42 Dizziness and giddiness: Secondary | ICD-10-CM

## 2015-07-30 DIAGNOSIS — R0602 Shortness of breath: Secondary | ICD-10-CM

## 2015-07-30 DIAGNOSIS — I350 Nonrheumatic aortic (valve) stenosis: Secondary | ICD-10-CM

## 2015-07-30 DIAGNOSIS — R0609 Other forms of dyspnea: Secondary | ICD-10-CM

## 2015-07-30 DIAGNOSIS — I10 Essential (primary) hypertension: Secondary | ICD-10-CM

## 2015-07-30 DIAGNOSIS — H6120 Impacted cerumen, unspecified ear: Secondary | ICD-10-CM

## 2015-07-30 DIAGNOSIS — I5030 Unspecified diastolic (congestive) heart failure: Secondary | ICD-10-CM | POA: Insufficient documentation

## 2015-07-30 DIAGNOSIS — R918 Other nonspecific abnormal finding of lung field: Secondary | ICD-10-CM | POA: Insufficient documentation

## 2015-07-30 MED ORDER — AZITHROMYCIN 250 MG PO TABS: ORAL_TABLET | ORAL | Status: DC

## 2015-07-30 MED ORDER — MECLIZINE HCL 12.5 MG PO TABS: 12.5000 mg | ORAL_TABLET | Freq: Three times a day (TID) | ORAL | Status: DC | PRN

## 2015-07-30 MED ORDER — CARBAMIDE PEROXIDE 6.5 % OT SOLN: 5.0000 [drp] | Freq: Two times a day (BID) | OTIC | Status: DC

## 2015-07-30 MED ORDER — ALBUTEROL SULFATE HFA 108 (90 BASE) MCG/ACT IN AERS: 2.0000 | INHALATION_SPRAY | Freq: Four times a day (QID) | RESPIRATORY_TRACT | Status: DC | PRN

## 2015-07-30 NOTE — Progress Notes (Signed)
Patient here with interpreter Complains of feeling dizzy, having some SOB and bilateral feet swelling Patient also states he stopped on his own taking his Imdur

## 2015-07-30 NOTE — Progress Notes (Signed)
MRN: [id number] Name: [name]  Sex: male Age: 79 y.o. DOB: Aug 27, 1936  Allergies: Review of patient's allergies indicates no known allergies.  Chief Complaint  Patient presents with  . Follow-up    HPI: Patient is 79 y.o. male who has history of hypertension, diastolic CHF, GERD, BPH, aortic stenosis, renal insufficiency comes today for followup, patient reported to have some headache and dizziness for the last one to 2 weeks,reported to have his blood pressure being on the lower side, he should stop taking imdur, he does report improvement in the symptoms but still has minimal dizziness, denies any chest pain but does complain of exertional shortness of breath, ringing in the ears, patient denies smoking cigarettes, has some leg swelling, denies any exertional chest pain, has baseline 2 pillow orthopnea, denies any PND. Patient also reported that his shortness of breath improved with using albuterol.  Past Medical History  Diagnosis Date  . Hypertension   . Gouty arthritis of toe of right foot   . GERD (gastroesophageal reflux disease)   . Shortness of breath dyspnea   . BPH (benign prostatic hyperplasia)     Past Surgical History  Procedure Laterality Date  . Prostatectomy        Medication List       This list is accurate as of: 07/30/15 12:49 PM.  Always use your most recent med list.               albuterol 108 (90 BASE) MCG/ACT inhaler  Commonly known as:  PROVENTIL HFA;VENTOLIN HFA  Inhale 2 puffs into the lungs every 6 (six) hours as needed for wheezing or shortness of breath.     allopurinol 100 MG tablet  Commonly known as:  ZYLOPRIM  Take 1 tablet (100 mg total) by mouth daily.     aspirin 81 MG EC tablet  Take 1 tablet (81 mg total) by mouth daily.     atorvastatin 40 MG tablet  Commonly known as:  LIPITOR  Take 1 tablet (40 mg total) by mouth daily at 6 PM.     carbamide peroxide 6.5 % otic solution  Commonly known as:  DEBROX  Place 5 drops  into both ears 2 (two) times daily.     carvedilol 25 MG tablet  Commonly known as:  COREG  Take 1 tablet (25 mg total) by mouth 2 (two) times daily with a meal.     colchicine 0.6 MG tablet  Take 1 tablet (0.6 mg total) by mouth 2 (two) times daily.     hydrALAZINE 25 MG tablet  Commonly known as:  APRESOLINE  Take 1 tablet (25 mg total) by mouth 3 (three) times daily.     isosorbide mononitrate 30 MG 24 hr tablet  Commonly known as:  IMDUR  Take 1 tablet (30 mg total) by mouth daily.     meclizine 12.5 MG tablet  Commonly known as:  ANTIVERT  Take 1 tablet (12.5 mg total) by mouth 3 (three) times daily as needed for nausea.        Meds ordered this encounter  Medications  . carbamide peroxide (DEBROX) 6.5 % otic solution    Sig: Place 5 drops into both ears 2 (two) times daily.    Dispense:  15 mL    Refill:  1  . meclizine (ANTIVERT) 12.5 MG tablet    Sig: Take 1 tablet (12.5 mg total) by mouth 3 (three) times daily as needed for nausea.    Dispense:  30 tablet    Refill:  3  . albuterol (PROVENTIL HFA;VENTOLIN HFA) 108 (90 BASE) MCG/ACT inhaler    Sig: Inhale 2 puffs into the lungs every 6 (six) hours as needed for wheezing or shortness of breath.    Dispense:  1 Inhaler    Refill:  0    Immunization History  Administered Date(s) Administered  . Pneumococcal Polysaccharide-23 05/31/2015    Family History  Problem Relation Age of Onset  . Hypertension Father   . Hypertension Sister   . Hypertension Brother     History  Substance Use Topics  . Smoking status: Never Smoker   . Smokeless tobacco: Not on file  . Alcohol Use: No    Review of Systems   As noted in HPI  Filed Vitals:   07/30/15 1134  BP: 132/56  Pulse: 73  Temp: 98 F (36.7 C)  Resp: 16    Physical Exam  Physical Exam  Constitutional: No distress.  HENT:  Increased wax in both ears  Eyes: EOM are normal. Pupils are equal, round, and reactive to light.  Cardiovascular: Normal  rate and regular rhythm.   Murmur heard. Pulmonary/Chest: No respiratory distress. He has no wheezes. He has no rales.  Abdominal: Soft. There is no tenderness. There is no rebound.  Musculoskeletal:  Trace pedal edema    Labs   Lab Results  Component Value Date   WBC 6.0 05/28/2015   HGB 11.4* 05/28/2015   HCT 35.7* 05/28/2015   PLT 146* 05/28/2015   GLUCOSE 99 06/07/2015   CHOL 255* 05/29/2015   TRIG 114 05/29/2015   HDL 43 05/29/2015   LDLCALC 189* 05/29/2015   ALT 14* 05/29/2015   AST 13* 05/29/2015   NA 140 06/07/2015   K 4.6 06/07/2015   CL 107 06/07/2015   CREATININE 1.59* 06/07/2015   BUN 21 06/07/2015   CO2 25 06/07/2015   TSH 3.411 05/29/2015   INR 1.20 05/29/2015   HGBA1C 6.2* 05/29/2015    Lab Results  Component Value Date   HGBA1C 6.2* 05/29/2015     Assessment and Plan  Essential hypertension/diastolic CHF Patient is currently on Coreg, hydralazine, aspirin.  Dizziness and giddiness - Plan: trial of meclizine (ANTIVERT) 12.5 MG tablet  Excess ear wax, unspecified laterality - Plan: carbamide peroxide (DEBROX) 6.5 % otic solution  SOB (shortness of breath) - Plan: DG Chest 2 View  Reported pneumonitis will treat with zithormax , albuterol (PROVENTIL HFA;VENTOLIN HFA) 108 (90 BASE) MCG/ACT inhaler  Aortic stenosis, moderate/Exertional dyspnea/CHF  Dizziness symptoms can be secondary to aortic stenosis, patient will be scheduled appointment with cardiology.    Return in about 3 months (around 10/30/2015) for hypertension.   This note has been created with Education officer, environmental. Any transcriptional errors are unintentional.    Doris Cheadle, MD

## 2015-08-01 ENCOUNTER — Ambulatory Visit: Payer: No Typology Code available for payment source | Admitting: Cardiology

## 2015-08-05 ENCOUNTER — Encounter (HOSPITAL_COMMUNITY): Payer: Self-pay | Admitting: Emergency Medicine

## 2015-08-05 ENCOUNTER — Inpatient Hospital Stay (HOSPITAL_COMMUNITY)
Admission: EM | Admit: 2015-08-05 | Discharge: 2015-08-10 | DRG: 378 | Disposition: A | Discharge status: Home / Self Care | Payer: Medicaid Other | Attending: Internal Medicine | Admitting: Internal Medicine

## 2015-08-05 ENCOUNTER — Emergency Department (HOSPITAL_COMMUNITY): Payer: Medicaid Other

## 2015-08-05 DIAGNOSIS — D509 Iron deficiency anemia, unspecified: Secondary | ICD-10-CM

## 2015-08-05 DIAGNOSIS — Z8249 Family history of ischemic heart disease and other diseases of the circulatory system: Secondary | ICD-10-CM

## 2015-08-05 DIAGNOSIS — Z9079 Acquired absence of other genital organ(s): Secondary | ICD-10-CM | POA: Diagnosis present

## 2015-08-05 DIAGNOSIS — K648 Other hemorrhoids: Secondary | ICD-10-CM | POA: Diagnosis present

## 2015-08-05 DIAGNOSIS — R42 Dizziness and giddiness: Secondary | ICD-10-CM

## 2015-08-05 DIAGNOSIS — Z7982 Long term (current) use of aspirin: Secondary | ICD-10-CM | POA: Diagnosis not present

## 2015-08-05 DIAGNOSIS — I350 Nonrheumatic aortic (valve) stenosis: Secondary | ICD-10-CM | POA: Diagnosis present

## 2015-08-05 DIAGNOSIS — R0602 Shortness of breath: Secondary | ICD-10-CM | POA: Diagnosis present

## 2015-08-05 DIAGNOSIS — I10 Essential (primary) hypertension: Secondary | ICD-10-CM | POA: Diagnosis present

## 2015-08-05 DIAGNOSIS — I5032 Chronic diastolic (congestive) heart failure: Secondary | ICD-10-CM

## 2015-08-05 DIAGNOSIS — R6881 Early satiety: Secondary | ICD-10-CM | POA: Diagnosis present

## 2015-08-05 DIAGNOSIS — K219 Gastro-esophageal reflux disease without esophagitis: Secondary | ICD-10-CM | POA: Diagnosis present

## 2015-08-05 DIAGNOSIS — M10071 Idiopathic gout, right ankle and foot: Secondary | ICD-10-CM | POA: Diagnosis present

## 2015-08-05 DIAGNOSIS — D62 Acute posthemorrhagic anemia: Secondary | ICD-10-CM

## 2015-08-05 DIAGNOSIS — N400 Enlarged prostate without lower urinary tract symptoms: Secondary | ICD-10-CM | POA: Diagnosis present

## 2015-08-05 DIAGNOSIS — I130 Hypertensive heart and chronic kidney disease with heart failure and stage 1 through stage 4 chronic kidney disease, or unspecified chronic kidney disease: Secondary | ICD-10-CM

## 2015-08-05 DIAGNOSIS — N183 Chronic kidney disease, stage 3 (moderate): Secondary | ICD-10-CM | POA: Diagnosis present

## 2015-08-05 DIAGNOSIS — I129 Hypertensive chronic kidney disease with stage 1 through stage 4 chronic kidney disease, or unspecified chronic kidney disease: Secondary | ICD-10-CM | POA: Diagnosis present

## 2015-08-05 DIAGNOSIS — K922 Gastrointestinal hemorrhage, unspecified: Secondary | ICD-10-CM | POA: Diagnosis present

## 2015-08-05 DIAGNOSIS — K2951 Unspecified chronic gastritis with bleeding: Secondary | ICD-10-CM | POA: Diagnosis not present

## 2015-08-05 DIAGNOSIS — Z79899 Other long term (current) drug therapy: Secondary | ICD-10-CM | POA: Diagnosis not present

## 2015-08-05 DIAGNOSIS — K921 Melena: Secondary | ICD-10-CM | POA: Diagnosis present

## 2015-08-05 DIAGNOSIS — R0609 Other forms of dyspnea: Secondary | ICD-10-CM | POA: Diagnosis present

## 2015-08-05 DIAGNOSIS — K2981 Duodenitis with bleeding: Principal | ICD-10-CM | POA: Diagnosis present

## 2015-08-05 DIAGNOSIS — N189 Chronic kidney disease, unspecified: Secondary | ICD-10-CM | POA: Diagnosis present

## 2015-08-05 LAB — URINALYSIS, ROUTINE W REFLEX MICROSCOPIC
Bilirubin Urine: NEGATIVE
Glucose, UA: NEGATIVE mg/dL
Hgb urine dipstick: NEGATIVE
Ketones, ur: NEGATIVE mg/dL
Leukocytes, UA: NEGATIVE
Nitrite: NEGATIVE
Protein, ur: NEGATIVE mg/dL
Specific Gravity, Urine: 1.013 (ref 1.005–1.030)
Urobilinogen, UA: 1 mg/dL (ref 0.0–1.0)
pH: 5.5 (ref 5.0–8.0)

## 2015-08-05 LAB — POC OCCULT BLOOD, ED: Fecal Occult Bld: POSITIVE — AB

## 2015-08-05 LAB — BASIC METABOLIC PANEL
ANION GAP: 6 (ref 5–15)
BUN: 27 mg/dL — AB (ref 6–20)
CALCIUM: 9 mg/dL (ref 8.9–10.3)
CHLORIDE: 109 mmol/L (ref 101–111)
CO2: 24 mmol/L (ref 22–32)
Creatinine, Ser: 1.56 mg/dL — ABNORMAL HIGH (ref 0.61–1.24)
GFR calc Af Amer: 48 mL/min — ABNORMAL LOW (ref >60)
GFR, EST NON AFRICAN AMERICAN: 41 mL/min — AB (ref >60)
Glucose, Bld: 92 mg/dL (ref 65–99)
POTASSIUM: 4.3 mmol/L (ref 3.5–5.1)
SODIUM: 139 mmol/L (ref 135–145)

## 2015-08-05 LAB — CBC
HCT: 17.5 % — ABNORMAL LOW (ref 39.0–52.0)
Hemoglobin: 5.4 g/dL — CL (ref 13.0–17.0)
MCH: 24.2 pg — ABNORMAL LOW (ref 26.0–34.0)
MCHC: 30.9 g/dL (ref 30.0–36.0)
MCV: 78.5 fL (ref 78.0–100.0)
Platelets: 155 10*3/uL (ref 150–400)
RBC: 2.23 MIL/uL — ABNORMAL LOW (ref 4.22–5.81)
RDW: 17.5 % — ABNORMAL HIGH (ref 11.5–15.5)
WBC: 5.2 10*3/uL (ref 4.0–10.5)

## 2015-08-05 LAB — I-STAT TROPONIN, ED: TROPONIN I, POC: 0.01 ng/mL (ref 0.00–0.08)

## 2015-08-05 LAB — ABO/RH: ABO/RH(D): O POS

## 2015-08-05 LAB — PREPARE RBC (CROSSMATCH)

## 2015-08-05 LAB — BRAIN NATRIURETIC PEPTIDE: B Natriuretic Peptide: 853.8 pg/mL — ABNORMAL HIGH (ref 0.0–100.0)

## 2015-08-05 MED ORDER — SODIUM CHLORIDE 0.9 % IJ SOLN
3.0000 mL | Freq: Two times a day (BID) | INTRAMUSCULAR | Status: DC
  Administered 2015-08-05 – 2015-08-06 (×3): 3 mL via INTRAVENOUS
  Administered 2015-08-07: 5 mL via INTRAVENOUS

## 2015-08-05 MED ORDER — SODIUM CHLORIDE 0.9 % IV SOLN
10.0000 mL/h | Freq: Once | INTRAVENOUS | Status: AC
  Administered 2015-08-05: 10 mL/h via INTRAVENOUS

## 2015-08-05 MED ORDER — ALBUTEROL SULFATE (2.5 MG/3ML) 0.083% IN NEBU: 2.5000 mg | INHALATION_SOLUTION | Freq: Four times a day (QID) | RESPIRATORY_TRACT | Status: DC | PRN

## 2015-08-05 MED ORDER — HYDRALAZINE HCL 25 MG PO TABS
25.0000 mg | ORAL_TABLET | Freq: Three times a day (TID) | ORAL | Status: DC
  Administered 2015-08-05 – 2015-08-10 (×14): 25 mg via ORAL
  Filled 2015-08-05 (×15): qty 1

## 2015-08-05 MED ORDER — ACETAMINOPHEN 325 MG PO TABS
650.0000 mg | ORAL_TABLET | Freq: Once | ORAL | Status: AC
  Administered 2015-08-05: 650 mg via ORAL
  Filled 2015-08-05: qty 2

## 2015-08-05 MED ORDER — DIPHENHYDRAMINE HCL 25 MG PO CAPS
25.0000 mg | ORAL_CAPSULE | Freq: Once | ORAL | Status: AC
  Administered 2015-08-05: 25 mg via ORAL
  Filled 2015-08-05: qty 1

## 2015-08-05 MED ORDER — ALLOPURINOL 100 MG PO TABS
100.0000 mg | ORAL_TABLET | Freq: Every day | ORAL | Status: DC
  Administered 2015-08-06 – 2015-08-10 (×4): 100 mg via ORAL
  Filled 2015-08-05 (×4): qty 1

## 2015-08-05 MED ORDER — SODIUM CHLORIDE 0.9 % IJ SOLN
3.0000 mL | Freq: Two times a day (BID) | INTRAMUSCULAR | Status: DC
  Administered 2015-08-06 – 2015-08-07 (×2): 3 mL via INTRAVENOUS
  Administered 2015-08-08: 10 mL via INTRAVENOUS
  Administered 2015-08-08 – 2015-08-09 (×3): 3 mL via INTRAVENOUS

## 2015-08-05 MED ORDER — PANTOPRAZOLE SODIUM 40 MG IV SOLR
40.0000 mg | Freq: Two times a day (BID) | INTRAVENOUS | Status: DC
  Administered 2015-08-05 – 2015-08-10 (×10): 40 mg via INTRAVENOUS
  Filled 2015-08-05 (×12): qty 40

## 2015-08-05 MED ORDER — ALBUTEROL SULFATE HFA 108 (90 BASE) MCG/ACT IN AERS: 2.0000 | INHALATION_SPRAY | Freq: Four times a day (QID) | RESPIRATORY_TRACT | Status: DC | PRN

## 2015-08-05 MED ORDER — SODIUM CHLORIDE 0.9 % IV SOLN
INTRAVENOUS | Status: AC
  Administered 2015-08-05: 75 mL via INTRAVENOUS

## 2015-08-05 MED ORDER — CARVEDILOL 12.5 MG PO TABS
25.0000 mg | ORAL_TABLET | Freq: Two times a day (BID) | ORAL | Status: DC
  Administered 2015-08-05 – 2015-08-10 (×9): 25 mg via ORAL
  Filled 2015-08-05: qty 1
  Filled 2015-08-05: qty 2
  Filled 2015-08-05 (×2): qty 1
  Filled 2015-08-05 (×2): qty 2
  Filled 2015-08-05 (×2): qty 1
  Filled 2015-08-05: qty 2

## 2015-08-05 MED ORDER — FUROSEMIDE 10 MG/ML IJ SOLN
40.0000 mg | Freq: Once | INTRAMUSCULAR | Status: AC
  Administered 2015-08-05: 40 mg via INTRAVENOUS
  Filled 2015-08-05: qty 4

## 2015-08-05 NOTE — ED Notes (Signed)
Critical value: Hgb 5.4 reported to Dr. Blinda Leatherwood at 1504.

## 2015-08-05 NOTE — ED Notes (Signed)
Attempted report 

## 2015-08-05 NOTE — ED Notes (Signed)
Daughter stated, he is always  SOB and it seems worse since yesterday.  He's been dizzy, and his face is swollen

## 2015-08-05 NOTE — H&P (Signed)
Date: 08/05/2015               Patient Name:  [name] MRN: [id number]  DOB: October 18, 1936 Age / Sex: 79 y.o., male   PCP: Doris Cheadle, MD         Medical Service: Internal Medicine Teaching Service         Attending Physician: Dr. Doneen Poisson, MD    First Contact: Dr. Geralyn Corwin, MS4 Pager: [phone]  Second Contact: Dr. Evelena Peat Pager: [phone]       After Hours (After 5p/  First Contact Pager: [phone]  weekends / holidays): Second Contact Pager: [phone]   Chief Complaint: dyspnea x 2 weeks  History of Present Illness: Mr. Esterline is a 79 year old male with PMH of HTN, diastolic dysfunction, GERD and gout who presents with 2 weeks of DOE.   He denies chest pain, PND, orthopnea.  Upon questioning, he reports small amount of BRBPR every few months for several years.  However, his stools have been darker for the past two months.  He denies N/V or abdominal pain but feels his appetite has not been the best lately and he has some early satiety.  He denies NSAID use or EtOH use.  His daughter says he had endoscopy and colonoscopy by [facility] back in 2011 to evaluate esophageal thickening on CT (incidental finding on CTA to r/o PE).  She says there were no abnormalities found and he was told he had GERD.    In the ED:  T98.68F, RR 17, SpO2 99-100% on RA, HR 60s-80s, BP 130s-170s/50-80s.  Hgb 5.4.  FOBT positive.  He was types and screened and given 2 units of PRBCs.  He was feeling better upon my exam.  Meds: No current facility-administered medications for this encounter.   Current Outpatient Prescriptions  Medication Sig Dispense Refill  . albuterol (PROVENTIL HFA;VENTOLIN HFA) 108 (90 BASE) MCG/ACT inhaler Inhale 2 puffs into the lungs every 6 (six) hours as needed for wheezing or shortness of breath. 1 Inhaler 0  . allopurinol (ZYLOPRIM) 100 MG tablet Take 1 tablet (100 mg total) by mouth daily. 30 tablet 6  . aspirin 81 MG EC tablet Take 1 tablet (81 mg total) by mouth daily. 30  tablet 1  . atorvastatin (LIPITOR) 40 MG tablet Take 1 tablet (40 mg total) by mouth daily at 6 PM. (Patient taking differently: Take 40 mg by mouth daily. ) 90 tablet 1  . carvedilol (COREG) 25 MG tablet Take 1 tablet (25 mg total) by mouth 2 (two) times daily with a meal. 60 tablet 1  . hydrALAZINE (APRESOLINE) 25 MG tablet Take 1 tablet (25 mg total) by mouth 3 (three) times daily. 90 tablet 1  . meclizine (ANTIVERT) 25 MG tablet Take 12.5 mg by mouth 3 (three) times daily as needed for dizziness.    Marland Kitchen azithromycin (ZITHROMAX Z-PAK) 250 MG tablet Take as directed (Patient not taking: Reported on 08/05/2015) 6 each 0  . colchicine 0.6 MG tablet Take 1 tablet (0.6 mg total) by mouth 2 (two) times daily. (Patient not taking: Reported on 08/05/2015) 28 tablet 0  . isosorbide mononitrate (IMDUR) 30 MG 24 hr tablet Take 1 tablet (30 mg total) by mouth daily. (Patient not taking: Reported on 08/05/2015) 90 tablet 1  . meclizine (ANTIVERT) 12.5 MG tablet Take 1 tablet (12.5 mg total) by mouth 3 (three) times daily as needed for nausea. (Patient not taking: Reported on 08/05/2015) 30 tablet 3  Allergies: Allergies as of 08/05/2015  . (No Known Allergies)   Past Medical History  Diagnosis Date  . Hypertension   . Gouty arthritis of toe of right foot   . GERD (gastroesophageal reflux disease)   . Shortness of breath dyspnea   . BPH (benign prostatic hyperplasia)    Past Surgical History  Procedure Laterality Date  . Prostatectomy     Family History  Problem Relation Age of Onset  . Hypertension Father   . Hypertension Sister   . Hypertension Brother    History   Social History  . Marital Status: Married    Spouse Name: N/A  . Number of Children: N/A  . Years of Education: N/A   Occupational History  . Not on file.   Social History Main Topics  . Smoking status: Never Smoker   . Smokeless tobacco: Not on file  . Alcohol Use: No  . Drug Use: No  . Sexual Activity: Not on file    Other Topics Concern  . Not on file   Social History Narrative    Review of Systems: General:  Denies weight loss Cards:  Denies chest pain, PND, orthopnea; + intermittent B/L ankle/feet swelling Pulm:   Denies sore throat, cough; + DOE GI:  Denies abdominal pain, N/V, diarrhea; + early satiety, +intermittent BRBPR, he thinks he may felt a hemorrhoid at some point GU: Denies difficult urination or dark urine  Physical Exam: Blood pressure 168/68, pulse 71, temperature 98 F (36.7 C), temperature source Oral, resp. rate 16, height 5\' 2"  (1.575 m), weight 144 lb (65.318 kg), SpO2 100 %. General: resting in bed in NAD HEENT: PERRL, EOMI, no scleral icterus, mild conjunctival pallor Cardiac: RRR, no rubs or gallops, + 2/6 systolic murmur Pulm: clear to auscultation bilaterally, moving normal volumes of air Abd: soft, nontender, normal BS present, mild distention but hard to tell if voluntary guarding Ext: warm and well perfused, no pedal edema Neuro: alert and oriented X3, cranial nerves II-XII grossly intact, responding appropriately, MAE spontaneously  Lab results: Basic Metabolic Panel:  Recent Labs  16/10/96 1418  NA 139  K 4.3  CL 109  CO2 24  GLUCOSE 92  BUN 27*  CREATININE 1.56*  CALCIUM 9.0   CBC:  Recent Labs  08/05/15 1418  WBC 5.2  HGB 5.4*  HCT 17.5*  MCV 78.5  PLT 155   Cardiac Enzymes:  poc trop 0.01  Urinalysis:  Recent Labs  08/05/15 1700  COLORURINE YELLOW  LABSPEC 1.013  PHURINE 5.5  GLUCOSEU NEGATIVE  HGBUR NEGATIVE  BILIRUBINUR NEGATIVE  KETONESUR NEGATIVE  PROTEINUR NEGATIVE  UROBILINOGEN 1.0  NITRITE NEGATIVE  LEUKOCYTESUR NEGATIVE   Imaging results:  Dg Chest Port 1 View  08/05/2015   CLINICAL DATA:  Pt's daughter states the pt has been experiencing dizziness, SOB, and facial swelling x 1 day. Hx of HTN.  EXAM: PORTABLE CHEST - 1 VIEW  COMPARISON:  07/30/2015  FINDINGS: Cardiac silhouette is mildly enlarged. Normal  mediastinal and hilar contours.  Lungs are hyperexpanded but clear. No pleural effusion or pneumothorax.  Bony thorax is demineralized. Old healed fracture of the right midclavicle is stable.  IMPRESSION: No acute cardiopulmonary disease.   Electronically Signed   By: Amie Portland M.D.   On: 08/05/2015 16:28    Other results: EKG: normal sinus rhythm, HR 70, LA enlargement, TWI V5-V6 less pronounced than prior (pseudonormalization)  Assessment & Plan by Problem: Acute blood loss anemia 2/2 to GI bleed:  Hgb 11.4 2 months ago now 5.4 in the setting of intermittent BRBPR and dark stools x 2 months.  This is the likely cause for his DOE and lightheadedness.  Unclear if upper or lower source given reports of BRBPR and melena.  He last had BRBPR two weeks ago (reportedly a small amount noted on the back of his pants and on the couch when he stood up).  Differential includes diverticular disease (given his age and BRBPR, but he has no abdominal pain) vs malignancy (brother with colon CA, but no weight loss) vs AVM vs PUD.  Less likely ischemia (no abd pain) or hemorrhoids (unlikely to cause this degree of anemia). - admit on telemetry - continue 2 units PRBCs - check post-transfusion CBC and transfuse again if hgb < 7 - normal saline at 75cc/hr x 12 hours - IV protonix BID - hold ASA - consult to GI in AM - NPO past MN in case of GI intervention  HTN:  Continue home Coreg and hydral  CKD3:  stable  Diastolic dysfunction, grade 1:  EF 65-70% May 2016.  DOE likely due to blood loss as above.  No other HF symptoms.  BNP 853 (increased from 400s last admission) but no volume overload on CXR, exam not c/w volume overload.  VTE ppx:  No pharmacologic ppx in the setting of acute blood loss.  SCDs for ppx.  Dispo: Disposition is deferred at this time, awaiting improvement of current medical problems. Anticipated discharge in approximately 1-2 day(s).   The patient does have a current PCP (Doris Cheadle, MD) and does not need an [facility] hospital follow-up appointment after discharge.  The patient does not know have transportation limitations that hinder transportation to clinic appointments.  Signed: Yolanda Manges, DO 08/05/2015, 6:40 PM

## 2015-08-05 NOTE — ED Notes (Signed)
All belongings escorted up to floor with patient. 

## 2015-08-05 NOTE — ED Notes (Signed)
Admitting MD at bedside.

## 2015-08-05 NOTE — ED Notes (Signed)
Admitting team remains at bedside

## 2015-08-05 NOTE — H&P (Signed)
Date: 08/05/2015               Patient Name:  [name] MRN: [id number]  DOB: 1936/11/01 Age / Sex: 79 y.o., male   PCP: Doris Cheadle, MD              Medical Service: Internal Medicine Teaching Service              Attending Physician: Dr. Doneen Poisson, MD    First Contact: Geralyn Corwin, MS 4 Pager: [phone]  Second Contact: Dr. Andrey Campanile Pager: [phone]            After Hours (After 5p/  First Contact Pager: [phone]  weekends / holidays): Second Contact Pager: [phone]   Chief Complaint: Worsening SOB for the past 2 weeks  History of Present Illness:  [name] is a 79 yo male with a PMH of HTN, GERD, SOB, LV diastolic dysfunction and BPH who presented to the ED for SOB and dizziness.  History is translated by the daughter.  Patient reports feeling SOB and dizziness on exertion that has progressively worsened over the course of 2 weeks.  He has also noted facial swelling in the past 3 days around his eyes.  Daughter states that facial swelling is minimal currently.  Patient states that approximately 2 weeks ago he noticed bright red blood on the back of his pants and in the toilet.  This was isolated to one event.  He reports 2 BMs daily that are described as dark that he noticed starting in June.  He is not on oral iron.  In 2011 a CT scan showed an incidental finding of lower esophageal abnormality while in the ED that resulted in a GI consult.  [facility] in [place] reported normal findings on both EGD and colonoscopy.  Brother was diagnosed with Colon Cancer at 64.  Patient takes a baby asprin once daily.  He denies NSAID use, alcohol use or smoking.  Patient denies N/V, chest pain, abdominal pain, peripheral edema.        Meds: No current facility-administered medications for this encounter.   Current Outpatient Prescriptions  Medication Sig Dispense Refill  . albuterol (PROVENTIL HFA;VENTOLIN HFA) 108 (90 BASE) MCG/ACT inhaler Inhale 2 puffs into the lungs every 6 (six) hours as  needed for wheezing or shortness of breath. 1 Inhaler 0  . allopurinol (ZYLOPRIM) 100 MG tablet Take 1 tablet (100 mg total) by mouth daily. 30 tablet 6  . aspirin 81 MG EC tablet Take 1 tablet (81 mg total) by mouth daily. 30 tablet 1  . atorvastatin (LIPITOR) 40 MG tablet Take 1 tablet (40 mg total) by mouth daily at 6 PM. (Patient taking differently: Take 40 mg by mouth daily. ) 90 tablet 1  . carvedilol (COREG) 25 MG tablet Take 1 tablet (25 mg total) by mouth 2 (two) times daily with a meal. 60 tablet 1  . hydrALAZINE (APRESOLINE) 25 MG tablet Take 1 tablet (25 mg total) by mouth 3 (three) times daily. 90 tablet 1  . meclizine (ANTIVERT) 25 MG tablet Take 12.5 mg by mouth 3 (three) times daily as needed for dizziness.    Marland Kitchen azithromycin (ZITHROMAX Z-PAK) 250 MG tablet Take as directed (Patient not taking: Reported on 08/05/2015) 6 each 0  . colchicine 0.6 MG tablet Take 1 tablet (0.6 mg total) by mouth 2 (two) times daily. (Patient not taking: Reported on 08/05/2015) 28 tablet 0  . isosorbide mononitrate (IMDUR) 30 MG 24 hr tablet Take 1  tablet (30 mg total) by mouth daily. (Patient not taking: Reported on 08/05/2015) 90 tablet 1  . meclizine (ANTIVERT) 12.5 MG tablet Take 1 tablet (12.5 mg total) by mouth 3 (three) times daily as needed for nausea. (Patient not taking: Reported on 08/05/2015) 30 tablet 3    Allergies: Allergies as of 08/05/2015  . (No Known Allergies)   Past Medical History  Diagnosis Date  . Hypertension   . Gouty arthritis of toe of right foot   . GERD (gastroesophageal reflux disease)   . Shortness of breath dyspnea   . BPH (benign prostatic hyperplasia)    Past Surgical History  Procedure Laterality Date  . Prostatectomy     Family History  Problem Relation Age of Onset  . Hypertension Father   . Hypertension Sister   . Hypertension Brother    History   Social History  . Marital Status: Married    Spouse Name: N/A  . Number of Children: N/A  . Years of  Education: N/A   Occupational History  . Not on file.   Social History Main Topics  . Smoking status: Never Smoker   . Smokeless tobacco: Not on file  . Alcohol Use: No  . Drug Use: No  . Sexual Activity: Not on file   Other Topics Concern  . Not on file   Social History Narrative    Review of Systems: Review of Systems  Constitutional: Negative for weight loss.  Respiratory: Positive for shortness of breath.   Cardiovascular: Negative for chest pain.  Gastrointestinal: Positive for melena. Negative for nausea, vomiting, abdominal pain, diarrhea and constipation.  Musculoskeletal: Negative for falls.  Neurological: Positive for dizziness.     Physical Exam: Blood pressure 168/68, pulse 71, temperature 98 F (36.7 C), temperature source Oral, resp. rate 16, height 5\' 2"  (1.575 m), weight 65.318 kg (144 lb), SpO2 100 %.  General: resting in bed HEENT: PERRL, EOMI, no scleral icterus Cardiac: RRR, no rubs, murmur (not new) or gallops Pulm: clear to auscultation bilaterally, moving normal volumes of air Abd: soft, nontender, distended, BS present Ext: warm and well perfused, no pedal edema Neuro: alert and oriented X3, cranial nerves II-XII grossly intact   Lab results: Lab Results  Component Value Date   WBC 5.2 08/05/2015   HGB 5.4* 08/05/2015   HCT 17.5* 08/05/2015   MCV 78.5 08/05/2015   PLT 155 08/05/2015   BMET    Component Value Date/Time   NA 139 08/05/2015 1418   K 4.3 08/05/2015 1418   CL 109 08/05/2015 1418   CO2 24 08/05/2015 1418   GLUCOSE 92 08/05/2015 1418   BUN 27* 08/05/2015 1418   CREATININE 1.56* 08/05/2015 1418   CREATININE 1.59* 06/07/2015 1440   CALCIUM 9.0 08/05/2015 1418   GFRNONAA 41* 08/05/2015 1418   GFRAA 48* 08/05/2015 1418    Imaging results:  Dg Chest Port 1 View  08/05/2015   CLINICAL DATA:  Pt's daughter states the pt has been experiencing dizziness, SOB, and facial swelling x 1 day. Hx of HTN.  EXAM: PORTABLE CHEST - 1  VIEW  COMPARISON:  07/30/2015  FINDINGS: Cardiac silhouette is mildly enlarged. Normal mediastinal and hilar contours.  Lungs are hyperexpanded but clear. No pleural effusion or pneumothorax.  Bony thorax is demineralized. Old healed fracture of the right midclavicle is stable.  IMPRESSION: No acute cardiopulmonary disease.   Electronically Signed   By: Amie Portland M.D.   On: 08/05/2015 16:28    Other results:  EKG  Assessment & Plan by Problem: Active Problems:   Acute anemia 2/2 GI bleed:   Hgb of 5.4 and positive FOBT.  Patient has been experiencing progressively worsening SOB and dizziness over the past 2 weeks and only on exertion.  He denies orthopnea.  Patient has noticed bright red blood on back of pants in July, isolated to one event.  Patient reports dark stools 2x daily since June.  Abdomen is distended but no tenderness and bowel sounds are present.  Concern for upper GI bleed due to dark tarry stools for several months.  Patient does report hemorrhoids and this may be the cause of the bright red blood.  No diagnosis of diverticulosis on previous Colonoscopy in 2011. -Patient is receiving 2 units of blood in ED - Check CBC post transfusion for Hgb goal of 7.0 - Collect Iron studies - Consult GI, NPO after midnight  - Protonix BID - Re-do ECG due to poor study    CKD (chronic kidney disease):  Creatinine 1.56.  Patient is stable at baseline. -Continue to monitor   Essential hypertension:  BP 143/53 - 177/66 - Resumed Coreg and can add Hydralazine if needed  This is a Psychologist, occupational Note.  The care of the patient was discussed with Dr. Andrey Campanile and the assessment and plan was formulated with their assistance.  Please see their note for official documentation of the patient encounter.   Signed: Camelia Phenes, Med Student 08/05/2015, 6:46 PM

## 2015-08-05 NOTE — ED Provider Notes (Addendum)
CSN: [id number]     Arrival date & time 08/05/15  1250 History   First MD Initiated Contact with Patient 08/05/15 1450     Chief Complaint  Patient presents with  . Dizziness  . Shortness of Breath  . Facial Swelling     (Consider location/radiation/quality/duration/timing/severity/associated sxs/prior Treatment) HPI Comments: Patient presents to the ER for evaluation of progressively worsening shortness of breath, dizziness. Information provided by daughter who translates for the patient. Patient was hospitalized at the end of May for uncontrolled hypertension and congestive heart failure. Family reports that he was feeling better after discharge for several weeks, but then he started having the shortness of breath. He noticed dizziness with standing, daughter took his blood pressure and it was low. She stopped the Imdur that was started during his hospitalization. His symptoms continued. Today he has been extremely weak and has gotten short of breath with minimal exertion, returns to the ER for evaluation.  Patient is a 79 y.o. male presenting with dizziness and shortness of breath.  Dizziness Associated symptoms: shortness of breath   Shortness of Breath   Past Medical History  Diagnosis Date  . Hypertension   . Gouty arthritis of toe of right foot   . GERD (gastroesophageal reflux disease)   . Shortness of breath dyspnea   . BPH (benign prostatic hyperplasia)    Past Surgical History  Procedure Laterality Date  . Prostatectomy     Family History  Problem Relation Age of Onset  . Hypertension Father   . Hypertension Sister   . Hypertension Brother    History  Substance Use Topics  . Smoking status: Never Smoker   . Smokeless tobacco: Not on file  . Alcohol Use: No    Review of Systems  Respiratory: Positive for shortness of breath.   Gastrointestinal: Positive for constipation ("hard stools") and anal bleeding.  Neurological: Positive for dizziness.       Allergies  Review of patient's allergies indicates no known allergies.  Home Medications   Prior to Admission medications   Medication Sig Start Date End Date Taking? Authorizing Provider  albuterol (PROVENTIL HFA;VENTOLIN HFA) 108 (90 BASE) MCG/ACT inhaler Inhale 2 puffs into the lungs every 6 (six) hours as needed for wheezing or shortness of breath. 07/30/15   Doris Cheadle, MD  allopurinol (ZYLOPRIM) 100 MG tablet Take 1 tablet (100 mg total) by mouth daily. 06/20/15   Doris Cheadle, MD  aspirin 81 MG EC tablet Take 1 tablet (81 mg total) by mouth daily. 07/20/15   Doris Cheadle, MD  atorvastatin (LIPITOR) 40 MG tablet Take 1 tablet (40 mg total) by mouth daily at 6 PM. 07/20/15   Doris Cheadle, MD  azithromycin (ZITHROMAX Z-PAK) 250 MG tablet Take as directed 07/30/15   Doris Cheadle, MD  carbamide peroxide (DEBROX) 6.5 % otic solution Place 5 drops into both ears 2 (two) times daily. 07/30/15   Doris Cheadle, MD  carvedilol (COREG) 25 MG tablet Take 1 tablet (25 mg total) by mouth 2 (two) times daily with a meal. 07/20/15   Doris Cheadle, MD  colchicine 0.6 MG tablet Take 1 tablet (0.6 mg total) by mouth 2 (two) times daily. 07/20/15   Doris Cheadle, MD  hydrALAZINE (APRESOLINE) 25 MG tablet Take 1 tablet (25 mg total) by mouth 3 (three) times daily. 07/20/15   Doris Cheadle, MD  isosorbide mononitrate (IMDUR) 30 MG 24 hr tablet Take 1 tablet (30 mg total) by mouth daily. 07/20/15   Doris Cheadle, MD  meclizine (ANTIVERT) 12.5 MG tablet Take 1 tablet (12.5 mg total) by mouth 3 (three) times daily as needed for nausea. 07/30/15   Doris Cheadle, MD   BP 151/55 mmHg  Pulse 74  Temp(Src) 98.2 F (36.8 C) (Oral)  Resp 18  Ht  (1.575 m)  Wt 144 lb (65.318 kg)  BMI 26.33 kg/m2  SpO2 100% Physical Exam  Constitutional: He is oriented to person, place, and time. He appears well-developed and well-nourished. No distress.  HENT:  Head: Normocephalic and atraumatic.  Right Ear: Hearing  normal.  Left Ear: Hearing normal.  Nose: Nose normal.  Mouth/Throat: Oropharynx is clear and moist and mucous membranes are normal.  Eyes: Conjunctivae and EOM are normal. Pupils are equal, round, and reactive to light.  Neck: Normal range of motion. Neck supple.  Cardiovascular: Regular rhythm, S1 normal and S2 normal.  Exam reveals no gallop and no friction rub.   Murmur heard.  Systolic murmur is present with a grade of 2/6  Pulmonary/Chest: Effort normal and breath sounds normal. No respiratory distress. He exhibits no tenderness.  Abdominal: Soft. Normal appearance and bowel sounds are normal. There is no hepatosplenomegaly. There is no tenderness. There is no rebound, no guarding, no tenderness at McBurney's point and negative Murphy's sign. No hernia.  Genitourinary: Prostate normal. Rectal exam shows no external hemorrhoid, no fissure, no mass, no tenderness and anal tone normal.  Musculoskeletal: Normal range of motion.  Neurological: He is alert and oriented to person, place, and time. He has normal strength. No cranial nerve deficit or sensory deficit. Coordination normal. GCS eye subscore is 4. GCS verbal subscore is 5. GCS motor subscore is 6.  Skin: Skin is warm, dry and intact. No rash noted. No cyanosis.  Psychiatric: He has a normal mood and affect. His speech is normal and behavior is normal. Thought content normal.  Nursing note and vitals reviewed.   ED Course  Procedures (including critical care time) Labs Review Labs Reviewed  CBC - Abnormal; Notable for the following:    RBC 2.23 (*)    Hemoglobin 5.4 (*)    HCT 17.5 (*)    MCH 24.2 (*)    RDW 17.5 (*)    All other components within normal limits  BASIC METABOLIC PANEL  URINALYSIS, ROUTINE W REFLEX MICROSCOPIC (NOT AT [facility])  Rosezena Sensor, ED  POC OCCULT BLOOD, ED  POC OCCULT BLOOD, ED  TYPE AND SCREEN    Imaging Review No results found.   EKG Interpretation   Date/Time:  Sunday August 05 2015  13:55:48 EDT Ventricular Rate:  69 PR Interval:  156 QRS Duration: 80 QT Interval:  368 QTC Calculation: 394 R Axis:   71 Text Interpretation:  Normal sinus rhythm ST \\T \ T wave abnormality,  consider inferolateral ischemia Abnormal ECG No significant change since  last tracing Confirmed by Alekzander Cardell  MD, Juston Goheen [id number]) on 08/05/2015  2:50:50 PM      MDM   Final diagnoses:  None   GI bleed  Anemia  Patient presents to the emergency department for evaluation of weakness and shortness of breath. Patient was recently hospitalized for congestive heart failure and accelerated hypertension. During that hospitalization, medications were changed. Patient was started on aspirin, hydralazine, imdur, atorvastatin and his Coreg dose increased. Family reports that he did well for several weeks after discharge, but has started to have increased weakness and shortness of breath over the last several weeks. Daughter took his blood pressure because of severe dizziness  with ambulation and it was found to be 100/50, discontinued his Imdur because of this. His symptoms have progressed.  Patient does have mild edema of both lower extremities. He does not have significant crackles on examination. He is not hypoxic. Blood work, however, reveals profound anemia. Hemoglobin is 5.4. Further questioning reveals that he has had rectal bleeding. Patient has had intermittent episodes of bright red blood per rectum, but has not been experiencing any obvious bleeding recently. Rectal examination today, however, was heme positive. Patient will require hospitalization for blood transfusion and further workup for GI bleed.  Gilda Crease, MD 08/05/15 1557  Gilda Crease, MD 08/05/15 1610

## 2015-08-06 DIAGNOSIS — K921 Melena: Secondary | ICD-10-CM

## 2015-08-06 DIAGNOSIS — Z7982 Long term (current) use of aspirin: Secondary | ICD-10-CM

## 2015-08-06 LAB — CBC
HCT: 23.3 % — ABNORMAL LOW (ref 39.0–52.0)
HCT: 23.9 % — ABNORMAL LOW (ref 39.0–52.0)
HEMATOCRIT: 24.4 % — AB (ref 39.0–52.0)
Hemoglobin: 7.6 g/dL — ABNORMAL LOW (ref 13.0–17.0)
Hemoglobin: 7.8 g/dL — ABNORMAL LOW (ref 13.0–17.0)
Hemoglobin: 7.8 g/dL — ABNORMAL LOW (ref 13.0–17.0)
MCH: 25.5 pg — ABNORMAL LOW (ref 26.0–34.0)
MCH: 25.6 pg — ABNORMAL LOW (ref 26.0–34.0)
MCH: 25.2 pg — AB (ref 26.0–34.0)
MCHC: 32.6 g/dL (ref 30.0–36.0)
MCHC: 32.6 g/dL (ref 30.0–36.0)
MCHC: 32 g/dL (ref 30.0–36.0)
MCV: 78.2 fL (ref 78.0–100.0)
MCV: 78.4 fL (ref 78.0–100.0)
MCV: 79 fL (ref 78.0–100.0)
PLATELETS: 141 10*3/uL — AB (ref 150–400)
PLATELETS: 141 10*3/uL — AB (ref 150–400)
Platelets: 136 10*3/uL — ABNORMAL LOW (ref 150–400)
RBC: 2.98 MIL/uL — ABNORMAL LOW (ref 4.22–5.81)
RBC: 3.05 MIL/uL — ABNORMAL LOW (ref 4.22–5.81)
RBC: 3.09 MIL/uL — AB (ref 4.22–5.81)
RDW: 16.7 % — ABNORMAL HIGH (ref 11.5–15.5)
RDW: 16.7 % — AB (ref 11.5–15.5)
RDW: 17.1 % — AB (ref 11.5–15.5)
WBC: 5.8 10*3/uL (ref 4.0–10.5)
WBC: 6.1 10*3/uL (ref 4.0–10.5)
WBC: 6.2 10*3/uL (ref 4.0–10.5)

## 2015-08-06 LAB — TYPE AND SCREEN
ABO/RH(D): O POS
ANTIBODY SCREEN: NEGATIVE
UNIT DIVISION: 0
Unit division: 0

## 2015-08-06 LAB — COMPREHENSIVE METABOLIC PANEL
ALT: 24 U/L (ref 17–63)
AST: 16 U/L (ref 15–41)
Albumin: 3.1 g/dL — ABNORMAL LOW (ref 3.5–5.0)
Alkaline Phosphatase: 115 U/L (ref 38–126)
Anion gap: 8 (ref 5–15)
BUN: 27 mg/dL — ABNORMAL HIGH (ref 6–20)
CALCIUM: 8.5 mg/dL — AB (ref 8.9–10.3)
CHLORIDE: 109 mmol/L (ref 101–111)
CO2: 23 mmol/L (ref 22–32)
Creatinine, Ser: 1.61 mg/dL — ABNORMAL HIGH (ref 0.61–1.24)
GFR, EST AFRICAN AMERICAN: 46 mL/min — AB (ref >60)
GFR, EST NON AFRICAN AMERICAN: 40 mL/min — AB (ref >60)
GLUCOSE: 86 mg/dL (ref 65–99)
Potassium: 3.9 mmol/L (ref 3.5–5.1)
SODIUM: 140 mmol/L (ref 135–145)
TOTAL PROTEIN: 6.1 g/dL — AB (ref 6.5–8.1)
Total Bilirubin: 0.9 mg/dL (ref 0.3–1.2)

## 2015-08-06 LAB — MAGNESIUM: MAGNESIUM: 1.9 mg/dL (ref 1.7–2.4)

## 2015-08-06 LAB — TROPONIN I

## 2015-08-06 LAB — MRSA PCR SCREENING: MRSA by PCR: NEGATIVE

## 2015-08-06 NOTE — Progress Notes (Signed)
Consent signed by patient for EGD.

## 2015-08-06 NOTE — Progress Notes (Signed)
Md notified of hgb 7.6 which is up from 5.4.  Awaiting am labs.  Will continue to monitor.  [name]

## 2015-08-06 NOTE — Consult Note (Signed)
Referring Provider: Dr. Klima Primary Care Physician:  ADVANI, DEEPAK, MD Primary Gastroenterologist:  Unassigned  Reason for Consultation:  GI bleed  HPI: [name] is a 79 y.o. male with several week history of black stools and one episode of red blood per rectum about 2 weeks ago. Denies abdominal pain, N/V. Hgb 5.4 on admit (down from 11.4 at the end of May). Hgb 7.6 today s/p 2 U PRBCs. Has felt lightheaded prior to admit. Denies alcohol. Denies iron pills or Pepto-Bismol. Denies constipation. Colonoscopy in 2011 showed diffuse diverticulosis, medium-sized internal hemorrhoids and a hyperplastic polyp. EGD in 2010 showed mild GEJ inflammation. Denies NSAIDs. His daughter and son provide translation for him.  Past Medical History  Diagnosis Date  . Hypertension   . Gouty arthritis of toe of right foot   . GERD (gastroesophageal reflux disease)   . Shortness of breath dyspnea   . BPH (benign prostatic hyperplasia)     Past Surgical History  Procedure Laterality Date  . Prostatectomy      Prior to Admission medications   Medication Sig Start Date End Date Taking? Authorizing Provider  albuterol (PROVENTIL HFA;VENTOLIN HFA) 108 (90 BASE) MCG/ACT inhaler Inhale 2 puffs into the lungs every 6 (six) hours as needed for wheezing or shortness of breath. 07/30/15  Yes Deepak Advani, MD  allopurinol (ZYLOPRIM) 100 MG tablet Take 1 tablet (100 mg total) by mouth daily. 06/20/15  Yes Deepak Advani, MD  aspirin 81 MG EC tablet Take 1 tablet (81 mg total) by mouth daily. 07/20/15  Yes Deepak Advani, MD  atorvastatin (LIPITOR) 40 MG tablet Take 1 tablet (40 mg total) by mouth daily at 6 PM. Patient taking differently: Take 40 mg by mouth daily.  07/20/15  Yes Deepak Advani, MD  carvedilol (COREG) 25 MG tablet Take 1 tablet (25 mg total) by mouth 2 (two) times daily with a meal. 07/20/15  Yes Deepak Advani, MD  hydrALAZINE (APRESOLINE) 25 MG tablet Take 1 tablet (25 mg total) by mouth 3 (three) times  daily. 07/20/15  Yes Deepak Advani, MD  meclizine (ANTIVERT) 25 MG tablet Take 12.5 mg by mouth 3 (three) times daily as needed for dizziness.   Yes Historical Provider, MD  azithromycin (ZITHROMAX Z-PAK) 250 MG tablet Take as directed Patient not taking: Reported on 08/05/2015 07/30/15   Deepak Advani, MD  colchicine 0.6 MG tablet Take 1 tablet (0.6 mg total) by mouth 2 (two) times daily. Patient not taking: Reported on 08/05/2015 07/20/15   Deepak Advani, MD  isosorbide mononitrate (IMDUR) 30 MG 24 hr tablet Take 1 tablet (30 mg total) by mouth daily. Patient not taking: Reported on 08/05/2015 07/20/15   Deepak Advani, MD  meclizine (ANTIVERT) 12.5 MG tablet Take 1 tablet (12.5 mg total) by mouth 3 (three) times daily as needed for nausea. Patient not taking: Reported on 08/05/2015 07/30/15   Deepak Advani, MD    Scheduled Meds: . allopurinol  100 mg Oral Daily  . carvedilol  25 mg Oral BID WC  . hydrALAZINE  25 mg Oral TID  . pantoprazole (PROTONIX) IV  40 mg Intravenous Q12H  . sodium chloride  3 mL Intravenous Q12H  . sodium chloride  3 mL Intravenous Q12H   Continuous Infusions:  PRN Meds:.albuterol  Allergies as of 08/05/2015  . (No Known Allergies)    Family History  Problem Relation Age of Onset  . Hypertension Father   . Hypertension Sister   . Hypertension Brother     History     Social History  . Marital Status: Married    Spouse Name: N/A  . Number of Children: N/A  . Years of Education: N/A   Occupational History  . Not on file.   Social History Main Topics  . Smoking status: Never Smoker   . Smokeless tobacco: Not on file  . Alcohol Use: No  . Drug Use: No  . Sexual Activity: Not on file   Other Topics Concern  . Not on file   Social History Narrative    Review of Systems: All negative except as stated above in HPI.  Physical Exam: Vital signs: Filed Vitals:   08/06/15 1112  BP: 164/62  Pulse: 73  Temp: 98.1 F (36.7 C)  Resp: 19   Last BM Date:  08/05/15 General:  Elderly, Alert,  Thin, pleasant and cooperative in NAD HEENT: anicteric, oropharynx clear Lungs:  Clear throughout to auscultation.   No wheezes, crackles, or rhonchi. No acute distress. Heart:  Regular rate and rhythm; no murmurs, clicks, rubs,  or gallops. Abdomen: soft, nontender, nondistended, +BS  Rectal:  Deferred Ext: pulses intact, no edema  GI:  Lab Results:  Recent Labs  08/05/15 1418 08/06/15 0213 08/06/15 0540  WBC 5.2 5.8 6.1  HGB 5.4* 7.6* 7.8*  HCT 17.5* 23.3* 23.9*  PLT 155 136* 141*   BMET  Recent Labs  08/05/15 1418 08/06/15 0540  NA 139 140  K 4.3 3.9  CL 109 109  CO2 24 23  GLUCOSE 92 86  BUN 27* 27*  CREATININE 1.56* 1.61*  CALCIUM 9.0 8.5*   LFT  Recent Labs  08/06/15 0540  PROT 6.1*  ALBUMIN 3.1*  AST 16  ALT 24  ALKPHOS 115  BILITOT 0.9   PT/INR No results for input(s): LABPROT, INR in the last 72 hours.   Studies/Results: Dg Chest Port 1 View  08/05/2015   CLINICAL DATA:  Pt's daughter states the pt has been experiencing dizziness, SOB, and facial swelling x 1 day. Hx of HTN.  EXAM: PORTABLE CHEST - 1 VIEW  COMPARISON:  07/30/2015  FINDINGS: Cardiac silhouette is mildly enlarged. Normal mediastinal and hilar contours.  Lungs are hyperexpanded but clear. No pleural effusion or pneumothorax.  Bony thorax is demineralized. Old healed fracture of the right midclavicle is stable.  IMPRESSION: No acute cardiopulmonary disease.   Electronically Signed   By: David  Ormond M.D.   On: 08/05/2015 16:28    Impression/Plan: 79 yo with a 6 g drop in Hgb in the past 2 months with melena for several weeks concerning for an upper GI source such as peptic ulcer disease or an AVM. Maligancy also possible. Doubt colonic source and suspect one episode of hematochezia was due to hemorrhoids. EGD tomorrow morning to further evaluate. Clear liquids today. NPO p MN. Continue Protonix 40 mg IV Q 12 hours. Thank you for this  consultation.    LOS: 1 day   Crystina Borrayo C.  08/06/2015, 12:36 PM  Pager [phone]  If no answer or after 5 PM call [phone] 

## 2015-08-06 NOTE — Progress Notes (Signed)
Md paged for sbp 190's 200's.  Pt asymptomatic  Will obtain manual bp and call results. [name]

## 2015-08-06 NOTE — Evaluation (Signed)
  Occupational Therapy Evaluation and Discharge Patient Details Name: [name] MRN: [id number] DOB: 11/27/1936 Today's Date: 08/06/2015    History of Present Illness Pt adm with severe anemia with suspected GI bleed. PMH - HTN   Clinical Impression   This 79 yo male admitted with above presents to acute OT at a Independent level for BADLs, acute OT will sign off.    Follow Up Recommendations  No OT follow up    Equipment Recommendations  None recommended by OT       Precautions / Restrictions Precautions Precautions: None      Mobility Bed Mobility Overal bed mobility: Independent                Transfers Overall transfer level: Independent Equipment used: None         General transfer comment: Pt ambulated around 1/2 of unit without any balance issues noted--higher level balacne not tested         ADL Overall ADL's : Independent                                             Vision Additional Comments: No change from baseline          Pertinent Vitals/Pain Pain Assessment: No/denies pain     Hand Dominance Right   Extremity/Trunk Assessment Upper Extremity Assessment Upper Extremity Assessment: Overall WFL for tasks assessed     Communication Communication Communication: Prefers language other than English   Cognition Arousal/Alertness: Awake/alert Behavior During Therapy: WFL for tasks assessed/performed Overall Cognitive Status: Within Functional Limits for tasks assessed                                Home Living Family/patient expects to be discharged to:: Private residence Living Arrangements: Spouse/significant other Available Help at Discharge: Family;Available 24 hours/day               Bathroom Shower/Tub: Tub/shower unit;Curtain Shower/tub characteristics: Engineer, building services: Standard     Home Equipment: None          Prior Functioning/Environment Level of Independence:  Independent             OT Diagnosis: Generalized weakness         OT Goals(Current goals can be found in the care plan section) Acute Rehab OT Goals Patient Stated Goal: return home tomorrow  OT Frequency:                End of Session Equipment Utilized During Treatment:  (none)  Activity Tolerance: Patient tolerated treatment well Patient left: in bed;with call bell/phone within reach;with bed alarm set;with family/visitor present   Time: 4098-1191 OT Time Calculation (min): 15 min Charges:  OT General Charges $OT Visit: 1 Procedure OT Evaluation $Initial OT Evaluation Tier I: 1 Procedure  Evette Georges [phone] 08/06/2015, 3:20 PM

## 2015-08-06 NOTE — Progress Notes (Signed)
Md paged.  Pt had 5 beat run VT.  Pt asymptomatic.  Manual bp 186/75.  Will continue to monitor. [name]

## 2015-08-06 NOTE — H&P (Signed)
Internal Medicine Attending Admission Note Date: 08/06/2015  Patient name: [name] Medical record number: [id number] Date of birth: Jun 23, 1936 Age: 79 y.o. Gender: male  I saw and evaluated the patient. I reviewed the resident's note and I agree with the resident's findings and plan as documented in the resident's note.  Chief Complaint(s): Progressive shortness of breath and dizziness 2 weeks.  History - key components related to admission:  [name] is a 79 year old man with a history of hypertension, diastolic dysfunction , gastroesophageal reflux disease, diverticulosis, hemorrhoids, and gout who presents with 2 weeks of dyspnea on exertion , dizziness, and melanic stools. He denies any fevers, shakes, chills, nausea, vomiting, or abdominal pain. He had an episode of bright red blood per rectum 2 weeks ago which was minimal and felt to be related to his hemorrhoid. He has had no hematemesis or coffee-ground emesis. He does not drink or use non-steroidal anti-inflammatory's although he has been taking an 81 mg dose of aspirin daily. An esophagogastroduodenoscopy in 2010 showed mild gastroesophageal junction inflammation and a colonoscopy in 2011 showed diffuse diverticulosis , medium sized internal hemorrhoids, and a hyperplastic polyp. He was admitted to the internal medicine teaching service for further evaluation and care. Upon admission he was noted to have a hemoglobin of less than 6 and he received 2 units of packed red blood cells. On rounds this morning he noted he symptomatically felt much better from a shortness of breath standpoint. He is without other acute complaints at this time.  Physical Exam - key components related to admission:  Filed Vitals:   08/06/15 0300 08/06/15 0328 08/06/15 0725 08/06/15 1112  BP: 185/69 144/63 129/97 164/62  Pulse: 68 72 80 73  Temp:  98 F (36.7 C) 98.1 F (36.7 C) 98.1 F (36.7 C)  TempSrc:  Oral Oral Oral  Resp: 13 12 21 19   Height:       Weight:  139 lb 15.9 oz (63.5 kg)    SpO2: 100% 99% 100% 100%    Gen.: Well-developed, well-nourished, man lying comfortably in bed in no acute distress. Heart: Regular rate and rhythm.  Lungs: Clear to auscultation bilaterally without wheezes, rhonchi, rales. Abdomen: Soft, nontender, active bowel sounds.  Extremities: Without edema.  Lab results:  Basic Metabolic Panel:  Recent Labs  11/91/47 1418 08/06/15 0219 08/06/15 0540  NA 139  --  140  K 4.3  --  3.9  CL 109  --  109  CO2 24  --  23  GLUCOSE 92  --  86  BUN 27*  --  27*  CREATININE 1.56*  --  1.61*  CALCIUM 9.0  --  8.5*  MG  --  1.9  --    Liver Function Tests:  Recent Labs  08/06/15 0540  AST 16  ALT 24  ALKPHOS 115  BILITOT 0.9  PROT 6.1*  ALBUMIN 3.1*   CBC:  Recent Labs  08/06/15 0213 08/06/15 0540  WBC 5.8 6.1  HGB 7.6* 7.8*  HCT 23.3* 23.9*  MCV 78.2 78.4  PLT 136* 141*   Cardiac Enzymes:  Recent Labs  08/06/15 1300  TROPONINI <0.03   Urinalysis:   Clear, specific gravity 1.013, pH 5.5, negative nitrite, negative leukocytes.  Misc. Labs:   Fecal occult  Blood test : Positive  BNP 853.8  Imaging results:  Dg Chest Port 1 View  08/05/2015   CLINICAL DATA:  Pt's daughter states the pt has been experiencing dizziness, SOB, and facial swelling x 1  day. Hx of HTN.  EXAM: PORTABLE CHEST - 1 VIEW  COMPARISON:  07/30/2015  FINDINGS: Cardiac silhouette is mildly enlarged. Normal mediastinal and hilar contours.  Lungs are hyperexpanded but clear. No pleural effusion or pneumothorax.  Bony thorax is demineralized. Old healed fracture of the right midclavicle is stable.  IMPRESSION: No acute cardiopulmonary disease.   Electronically Signed   By: Amie Portland M.D.   On: 08/05/2015 16:28   Other results:  EKG: Normal sinus rhythm at 69 bpm, normal axis, normal intervals, LVH by voltage in the precordial leads, no significant Q waves , good R wave progression, no ST or T wave changes.  Unchanged from the previous ECG on 05/29/2015.  Assessment & Plan by Problem:  Mr. Buckle is a 79 year old man with a history of diastolic dysfunction, hypertension, diverticulosis, internal hemorrhoids, gastroesophageal reflux disease, and gout who presents with 2 weeks of dyspnea on exertion and dizziness. He was found to have a microcytic anemia and fecal occult positive stools in the setting of melanic stool. He likely has a upper GI bleed related to a chronic gastritis, H. Pylori, or an NSAID associated gastritis, in particular aspirin. An AV malformation could also provide this picture. I believe this bleeding has been more chronic than acute in that he has a microcytosis that is relatively new and suggests a possible iron deficiency. Unfortunately with the blood being given in the emergency department before further evaluation of his anemia could be undertaken we will never know if he was in fact iron deficient at the time of presentation. Nonetheless, he received IV iron essentially through his packed red blood cell transfusions.   1) Presumed upper GI bleed: he is currently on PPI therapy with an active type and screen and peripheral IVs. Given the relatively chronic nature of his disease he is stable for transfer to the general medical ward off of telemetry. He has been seen by gastroenterology and they are planning an EGD in the morning. Further evaluation and therapy for his presumed GI bleed are pending the results of this esophagogastroduodenoscopy.    2) Hypertension: he has chronic hypertension but had elevated blood pressures in the hospital last night. This is after his hydralazine and imdur had been held. Given the stability of his blood pressures since admission we will restart the hydralazine at 25 mg by mouth 3 times daily. If his blood pressure remains elevated tomorrow we will restart the imdur 30 mg by mouth daily.   3) Disposition: pending the stability of his hemoglobin over the  next 24 hours and the results of the esophagogastroduodenoscopy. Follow-up will be with his primary care provider.

## 2015-08-06 NOTE — Clinical Documentation Improvement (Signed)
Current notes state prior admission in May for uncontrolled hypertension and congestive heart failure; diastolic dysfunction grade 1, also noted no other HF symptoms; BNP 843.8 this admission, furosemide 40 mg ordered 08/05/15 once.  Please clarify acuity (chronic, acute on chronic, acute) of diastolic heart failure this admission and document in your progress notes and discharge summary.    Thank you, Doy Mince, RN [phone] Clinical Documentation Specialist

## 2015-08-06 NOTE — Progress Notes (Signed)
INTERNAL MEDICINE TEACHING SERVICE Night Float Progress Note   Subjective:    We were called overnight by the RN for evaluation of elevated BP in the 190s-200s.  Patient received scheduled Coreg  at 2130 and Hydralazine  at 2252.  Patient was asymptomatic.  Asked RN to obtain a manual BP which was 186/75 . At time of evaluation, pt was 167/67, lying in bed resting, asymptomatic.  Denied any CP or SOB.  Patient just finished receiving 2 units of PRBCs for hemoglobin of 5.4.   Objective:    BP 167/67 mmHg  Pulse 66  Temp(Src) 97.9 F (36.6 C) (Oral)  Resp 14  Ht  (1.575 m)  Wt 144 lb (65.318 kg)  BMI 26.33 kg/m2  SpO2 100%   Labs:  Cardiac Enzymes: Lab Results  Component Value Date   TROPONINI 0.04* 05/29/2015    Physical Exam: General: Well-developed, well-nourished, in no acute distress; alert, appropriate and cooperative throughout examination.  Lungs:  Normal respiratory effort. Clear to auscultation BL without crackles or wheezes.  Heart: RRR. S1 and S2 normal without gallop, murmur, or rubs.     Assessment/ Plan:    Elevated blood pressure -patient asymptomatic on exam.  BP was 167/67.  No intervention at this time. -instructed RN to page if BP >190/110.   Gwynn Burly, DO  08/06/2015, 12:47 AM

## 2015-08-06 NOTE — Evaluation (Signed)
Physical Therapy Evaluation Patient Details Name: [name] MRN: [id number] DOB: [birth year] Today's Date: 08/06/2015   History of Present Illness  Pt adm with severe anemia with suspected GI bleed. PMH - HTN  Clinical Impression  Pt admitted with above diagnosis and presents to PT with functional limitations due to deficits listed below (See PT problem list). Pt needs skilled PT to maximize independence and safety to allow discharge to home. Expect to regain independence quickly and doubt will require any PT after DC.     Follow Up Recommendations No PT follow up    Equipment Recommendations  None recommended by PT    Recommendations for Other Services       Precautions / Restrictions Precautions Precautions: None      Mobility  Bed Mobility Overal bed mobility: Modified Independent                Transfers Overall transfer level: Needs assistance Equipment used: None Transfers: Sit to/from Stand Sit to Stand: Min guard         General transfer comment: Assist for safety and balance  Ambulation/Gait Ambulation/Gait assistance: Min guard Ambulation Distance (Feet): 175 Feet Assistive device: None Gait Pattern/deviations: Step-through pattern;Decreased stride length   Gait velocity interpretation: at or above normal speed for age/gender General Gait Details: slightly unsteady but no LOB  Stairs            Wheelchair Mobility    Modified Rankin (Stroke Patients Only)       Balance Overall balance assessment: Needs assistance Sitting-balance support: No upper extremity supported;Feet supported Sitting balance-Leahy Scale: Normal     Standing balance support: No upper extremity supported Standing balance-Leahy Scale: Fair                               Pertinent Vitals/Pain Pain Assessment: No/denies pain    Home Living Family/patient expects to be discharged to:: Private residence Living Arrangements: Spouse/significant  other Available Help at Discharge: Family           Home Equipment: None      Prior Function Level of Independence: Independent               Hand Dominance        Extremity/Trunk Assessment   Upper Extremity Assessment: Defer to OT evaluation           Lower Extremity Assessment: Overall WFL for tasks assessed         Communication   Communication: Prefers language other than [place];Interpreter utilized (daughter)  Cognition Arousal/Alertness: Awake/alert Behavior During Therapy: WFL for tasks assessed/performed Overall Cognitive Status: Within Functional Limits for tasks assessed                      General Comments      Exercises        Assessment/Plan    PT Assessment Patient needs continued PT services  PT Diagnosis Difficulty walking   PT Problem List Decreased balance;Decreased mobility  PT Treatment Interventions Gait training;Functional mobility training;Therapeutic activities;Therapeutic exercise;Balance training;Patient/family education   PT Goals (Current goals can be found in the Care Plan section) Acute Rehab PT Goals Patient Stated Goal: return home PT Goal Formulation: With patient/family Time For Goal Achievement: 08/13/15 Potential to Achieve Goals: Good    Frequency Min 3X/week   Barriers to discharge        Co-evaluation  End of Session   Activity Tolerance: Patient tolerated treatment well Patient left: in chair;with call bell/phone within reach;with family/visitor present Nurse Communication: Mobility status         Time: 1152-1201 PT Time Calculation (min) (ACUTE ONLY): 9 min   Charges:   PT Evaluation $Initial PT Evaluation Tier I: 1 Procedure     PT G Codes:        [name] 08-16-15, 2:47 PM  [facility] PT [phone]

## 2015-08-06 NOTE — Progress Notes (Signed)
Subjective: [name] was evaluated on rounds this morning.  Patient reports feeling better today compared to yesterday.  He still has some SOB when he moves around in his bed, but is improving.  He states he has no difficulty breathing when laying flat.  He has not had a bowel movement since admission.  He denies dizziness, N/V, chest pain, abdominal pain, peripheral edema.  Objective: Vital signs in last 24 hours: Filed Vitals:   08/06/15 0300 08/06/15 0328 08/06/15 0725 08/06/15 1112  BP: 185/69 144/63 129/97 164/62  Pulse: 68 72 80 73  Temp:  98 F (36.7 C) 98.1 F (36.7 C) 98.1 F (36.7 C)  TempSrc:  Oral Oral Oral  Resp: 13 12 21 19   Height:      Weight:  63.5 kg (139 lb 15.9 oz)    SpO2: 100% 99% 100% 100%   Weight change:   Intake/Output Summary (Last 24 hours) at 08/06/15 1349 Last data filed at 08/06/15 1200  Gross per 24 hour  Intake 2263.42 ml  Output   1850 ml  Net 413.42 ml   General: resting in bed HEENT: PERRL, EOMI, no scleral icterus Cardiac: RRR, AS murmur noted (not new), no rubs or gallops Pulm: clear to auscultation bilaterally, moving normal volumes of air Abd: soft, nontender, nondistended, BS present Ext: warm and well perfused, no pedal edema Neuro: alert and oriented X3  Lab Results: CBC Latest Ref Rng 08/06/2015 08/06/2015 08/05/2015  WBC 4.0 - 10.5 K/uL 6.1 5.8 5.2  Hemoglobin 13.0 - 17.0 g/dL 7.8(L) 7.6(L) 5.4(LL)  Hematocrit 39.0 - 52.0 % 23.9(L) 23.3(L) 17.5(L)  Platelets 150 - 400 K/uL 141(L) 136(L) 155   BMET    Component Value Date/Time   NA 140 08/06/2015 0540   K 3.9 08/06/2015 0540   CL 109 08/06/2015 0540   CO2 23 08/06/2015 0540   GLUCOSE 86 08/06/2015 0540   BUN 27* 08/06/2015 0540   CREATININE 1.61* 08/06/2015 0540   CREATININE 1.59* 06/07/2015 1440   CALCIUM 8.5* 08/06/2015 0540   GFRNONAA 40* 08/06/2015 0540   GFRAA 46* 08/06/2015 0540     Micro Results: Recent Results (from the past 240 hour(s))  MRSA PCR Screening      Status: None   Collection Time: 08/05/15  8:39 PM  Result Value Ref Range Status   MRSA by PCR NEGATIVE NEGATIVE Final    Comment:        The GeneXpert MRSA Assay (FDA approved for NASAL specimens only), is one component of a comprehensive MRSA colonization surveillance program. It is not intended to diagnose MRSA infection nor to guide or monitor treatment for MRSA infections.    Studies/Results: Dg Chest Port 1 View  08/05/2015   CLINICAL DATA:  Pt's daughter states the pt has been experiencing dizziness, SOB, and facial swelling x 1 day. Hx of HTN.  EXAM: PORTABLE CHEST - 1 VIEW  COMPARISON:  07/30/2015  FINDINGS: Cardiac silhouette is mildly enlarged. Normal mediastinal and hilar contours.  Lungs are hyperexpanded but clear. No pleural effusion or pneumothorax.  Bony thorax is demineralized. Old healed fracture of the right midclavicle is stable.  IMPRESSION: No acute cardiopulmonary disease.   Electronically Signed   By: Amie Portland M.D.   On: 08/05/2015 16:28   Medications: I have reviewed the patient's current medications. Scheduled Meds: . allopurinol  100 mg Oral Daily  . carvedilol  25 mg Oral BID WC  . hydrALAZINE  25 mg Oral TID  . pantoprazole (PROTONIX) IV  40 mg Intravenous Q12H  . sodium chloride  3 mL Intravenous Q12H  . sodium chloride  3 mL Intravenous Q12H   Continuous Infusions:  PRN Meds:.albuterol Assessment/Plan: Principal Problem:      Acute blood loss anemia:  Likely causing his SOB.  CXR on 8/7 was negative for acute cardiopulmonary disease.  Patient had a Hgb of 5.4 on arrival and currently Hgb is 7.8 after receiving 2 units of blood in the ED.  Previous Hgb was 11.4 at the end of May.  Due to 2 months of dark stools there is a concern for an upper GI bleed.  Peptic Ulcer vs. AVM.  Less likely for lower GI bleed since bright red blood on pants was isolated to one event 2 weeks ago and patient reports hx of hemorrhoids.  Colonoscopy in 2011 per GI  consult note states procedure discovered diffuse diverticulosis and this may also be the cause for BRBPR.  Hemorrhoids and Diverticulosis less likely to cause a 6 point drop in Hgb.   - Protonix 40 mg IV BID - GI was consulted and Dr. Bosie Clos per note stated that EGD will be done tomorrow morning to assess for upper GI bleed - Clear liquid diet and NPO at midnight   - Two troponin levels checked 12 hours apart were both negative   Active Problems:   Essential hypertension: Currently stable - Resumed Coreg and Hydralazine, was held due to possible EGD or Colonoscopy.         CKD (chronic kidney disease): Creatinine 1.61. Patient is stable at baseline. -Continue to monitor       This is a Psychologist, occupational Note.  The care of the patient was discussed with Dr. Josem Kaufmann and the assessment and plan formulated with their assistance.  Please see their attached note for official documentation of the daily encounter.   LOS: 1 day   Camelia Phenes, Med Student 08/06/2015, 1:49 PM

## 2015-08-07 ENCOUNTER — Encounter (HOSPITAL_COMMUNITY)
Admission: EM | Disposition: A | Discharge status: Home / Self Care | Payer: Self-pay | Source: Home / Self Care | Attending: Internal Medicine

## 2015-08-07 ENCOUNTER — Ambulatory Visit: Payer: No Typology Code available for payment source | Admitting: Physician Assistant

## 2015-08-07 ENCOUNTER — Encounter (HOSPITAL_COMMUNITY): Payer: Self-pay | Admitting: *Deleted

## 2015-08-07 DIAGNOSIS — K2981 Duodenitis with bleeding: Principal | ICD-10-CM

## 2015-08-07 DIAGNOSIS — Z9889 Other specified postprocedural states: Secondary | ICD-10-CM

## 2015-08-07 HISTORY — PX: ESOPHAGOGASTRODUODENOSCOPY (EGD) WITH PROPOFOL: SHX5813

## 2015-08-07 LAB — CBC
HCT: 27.9 % — ABNORMAL LOW (ref 39.0–52.0)
HEMATOCRIT: 24.5 % — AB (ref 39.0–52.0)
Hemoglobin: 7.8 g/dL — ABNORMAL LOW (ref 13.0–17.0)
Hemoglobin: 8.8 g/dL — ABNORMAL LOW (ref 13.0–17.0)
MCH: 24.9 pg — ABNORMAL LOW (ref 26.0–34.0)
MCH: 25.4 pg — ABNORMAL LOW (ref 26.0–34.0)
MCHC: 31.8 g/dL (ref 30.0–36.0)
MCHC: 31.5 g/dL (ref 30.0–36.0)
MCV: 78.3 fL (ref 78.0–100.0)
MCV: 80.4 fL (ref 78.0–100.0)
PLATELETS: 147 10*3/uL — AB (ref 150–400)
Platelets: 159 10*3/uL (ref 150–400)
RBC: 3.13 MIL/uL — ABNORMAL LOW (ref 4.22–5.81)
RBC: 3.47 MIL/uL — ABNORMAL LOW (ref 4.22–5.81)
RDW: 17.2 % — ABNORMAL HIGH (ref 11.5–15.5)
RDW: 17.6 % — ABNORMAL HIGH (ref 11.5–15.5)
WBC: 7.8 10*3/uL (ref 4.0–10.5)
WBC: 8.1 10*3/uL (ref 4.0–10.5)

## 2015-08-07 SURGERY — ESOPHAGOGASTRODUODENOSCOPY (EGD) WITH PROPOFOL
Anesthesia: Moderate Sedation

## 2015-08-07 MED ORDER — SODIUM CHLORIDE 0.9 % IV SOLN
INTRAVENOUS | Status: DC
  Administered 2015-08-07: 500 mL via INTRAVENOUS

## 2015-08-07 MED ORDER — MIDAZOLAM HCL 10 MG/2ML IJ SOLN
INTRAMUSCULAR | Status: DC | PRN
  Administered 2015-08-07: 2 mg via INTRAVENOUS

## 2015-08-07 MED ORDER — FENTANYL CITRATE (PF) 100 MCG/2ML IJ SOLN
INTRAMUSCULAR | Status: DC | PRN
  Administered 2015-08-07: 25 ug via INTRAVENOUS

## 2015-08-07 MED ORDER — BUTAMBEN-TETRACAINE-BENZOCAINE 2-2-14 % EX AERO
INHALATION_SPRAY | CUTANEOUS | Status: DC | PRN
  Administered 2015-08-07: 2 via TOPICAL

## 2015-08-07 MED ORDER — DIPHENHYDRAMINE HCL 50 MG/ML IJ SOLN
INTRAMUSCULAR | Status: AC
  Filled 2015-08-07: qty 1

## 2015-08-07 MED ORDER — PEG 3350-KCL-NA BICARB-NACL 420 G PO SOLR
4000.0000 mL | Freq: Once | ORAL | Status: AC
  Administered 2015-08-07: 4000 mL via ORAL
  Filled 2015-08-07: qty 4000

## 2015-08-07 MED ORDER — SODIUM CHLORIDE 0.9 % IV SOLN
INTRAVENOUS | Status: DC
  Administered 2015-08-08: 06:00:00 via INTRAVENOUS

## 2015-08-07 MED ORDER — MIDAZOLAM HCL 5 MG/ML IJ SOLN
INTRAMUSCULAR | Status: AC
  Filled 2015-08-07: qty 2

## 2015-08-07 MED ORDER — FENTANYL CITRATE (PF) 100 MCG/2ML IJ SOLN
INTRAMUSCULAR | Status: AC
  Filled 2015-08-07: qty 2

## 2015-08-07 NOTE — Progress Notes (Addendum)
Moderate amount of bright red blood noted on bed pad. Pt stated that the blood has not been visible like this before.  Pt is in no distress.  MD notified. Will continue to closely monitor.   Estanislado Emms, RN

## 2015-08-07 NOTE — Progress Notes (Signed)
Utilization review complete. Peyton Rossner RN CCM Case Mgmt phone [phone] 

## 2015-08-07 NOTE — Progress Notes (Signed)
Subjective: [name] was evaluated on rounds this morning, son and daughter provided translation.  Patient reports no SOB or dizziness today.  PT had Carlos [name] walk around the room and patient states he feels like he is at baseline with breathing and walking.  He had one bowel movement yesterday and this morning of dark color.  He denies N/V, chest pain, abdominal pain, leg swelling, facial swelling, or generalized muscle weakness.     Objective: Vital signs in last 24 hours: Filed Vitals:   08/07/15 0815 08/07/15 0816 08/07/15 0906 08/07/15 1130  BP: 182/69 182/69 179/67 158/57  Pulse: 81 81  81  Temp:    98.1 F (36.7 C)  TempSrc:    Oral  Resp: 19 17  21   Height:      Weight:      SpO2: 100% 99%  100%   Weight change: -2.218 kg (-4 lb 14.2 oz) No intake or output data in the 24 hours ending 08/07/15 1200 General: resting in bed HEENT: PERRL, EOMI, no scleral icterus Cardiac: RRR, Aortic stenosis (not new), no rubs or gallops Pulm: clear to auscultation bilaterally, moving normal volumes of air Abd: soft, nontender, nondistended, BS present Ext: warm and well perfused, no pedal edema Neuro: alert and oriented X3   Lab Results: CBC Latest Ref Rng 08/07/2015 08/06/2015 08/06/2015  WBC 4.0 - 10.5 K/uL 7.8 6.2 6.1  Hemoglobin 13.0 - 17.0 g/dL 7.8(L) 7.8(L) 7.8(L)  Hematocrit 39.0 - 52.0 % 24.5(L) 24.4(L) 23.9(L)  Platelets 150 - 400 K/uL 147(L) 141(L) 141(L)     Micro Results: Recent Results (from the past 240 hour(s))  MRSA PCR Screening     Status: None   Collection Time: 08/05/15  8:39 PM  Result Value Ref Range Status   MRSA by PCR NEGATIVE NEGATIVE Final    Comment:        The GeneXpert MRSA Assay (FDA approved for NASAL specimens only), is one component of a comprehensive MRSA colonization surveillance program. It is not intended to diagnose MRSA infection nor to guide or monitor treatment for MRSA infections.    Studies/Results: Dg Chest Port 1 View  08/05/2015    CLINICAL DATA:  Pt's daughter states the pt has been experiencing dizziness, SOB, and facial swelling x 1 day. Hx of HTN.  EXAM: PORTABLE CHEST - 1 VIEW  COMPARISON:  07/30/2015  FINDINGS: Cardiac silhouette is mildly enlarged. Normal mediastinal and hilar contours.  Lungs are hyperexpanded but clear. No pleural effusion or pneumothorax.  Bony thorax is demineralized. Old healed fracture of the right midclavicle is stable.  IMPRESSION: No acute cardiopulmonary disease.   Electronically Signed   By: Amie Portland M.D.   On: 08/05/2015 16:28   Medications: I have reviewed the patient's current medications. Scheduled Meds: . allopurinol  100 mg Oral Daily  . carvedilol  25 mg Oral BID WC  . hydrALAZINE  25 mg Oral TID  . pantoprazole (PROTONIX) IV  40 mg Intravenous Q12H  . polyethylene glycol-electrolytes  4,000 mL Oral Once  . sodium chloride  3 mL Intravenous Q12H  . sodium chloride  3 mL Intravenous Q12H   Continuous Infusions: . sodium chloride     PRN Meds:.albuterol Assessment/Plan: Acute blood loss anemia: Likely causing his SOB on admission. HgB is currently stable at 7.8.  Patient had a Hgb of 5.4 on arrival and previous Hgb was 11.4 at the end of May.  This morning 8/9 Dr. Bosie Clos performed an EGD.  Minimal duodenitis was  found otherwise normal EGD.  No source of bleeding or anemia seen.  Due to 2 months of dark stools there was concern for an upper GI bleed. With no evidence of peptic ulcer disease or AVM now looking at the Colon for evidence of bleeding.  Per family hx patient has had episodes of bright red blood on pants.  These episodes happen every several months with only one incident two weeks ago of bright red blood on pants measuring less than 8oz.  Patient also reports hx of hemorrhoids and Colonoscopy in 2011 showed evidence of diverticular disease.  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  This syndrome causes GI bleeding from  angiodysplasia in the presence of aortic stenosis.  Consider capsule endoscopy outpatient if neg Colonoscopy.      - Continue Protonix 40 mg IV BID - Dr. Bosie Clos will perform Colonoscopy tomorrow - patient is stable and orders for transfer to med surg have been done - Go Lytely prep for today.  Clear liquid diet and NPO at midnight   Active Problems:  Essential hypertension: Currently stable - Continue Coreg and Hydralazine until midnight    CKD (chronic kidney disease): Stable at baseline - Since patient was at baseline on arrival to ED and after 1 day BMET is no longer being ordered.    This is a Psychologist, occupational Note.  The care of the patient was discussed with Dr. Andrey Campanile and the assessment and plan formulated with their assistance.  Please see their attached note for official documentation of the daily encounter.   LOS: 2 days   Camelia Phenes, Med Student 08/07/2015, 12:00 PM

## 2015-08-07 NOTE — Progress Notes (Signed)
Pt arrived to 4N19. Alert and oriented x4. Family at bedside. Pt speaks Arabic per daughter. Denies pain. Pt educated to call for assistance to get out of bed. Bed alarm and call bell within reach. Will continue to monitor.

## 2015-08-07 NOTE — Brief Op Note (Signed)
Minimal duodenitis otherwise normal EGD. No source of bleeding or anemia seen. Colonoscopy tomorrow. Prep today. See orders. Discussed with son and daughter.

## 2015-08-07 NOTE — Progress Notes (Signed)
Subjective: [name] was seen and examined this AM.  His denies dyspnea or CP and is feeling much better.  He is s/p EGD this AM and would like to eat.  Objective: Vital signs in last 24 hours: Filed Vitals:   08/07/15 0815 08/07/15 0816 08/07/15 0906 08/07/15 1130  BP: 182/69 182/69 179/67 158/57  Pulse: 81 81  81  Temp:    98.1 F (36.7 C)  TempSrc:    Oral  Resp: 19 17  21   Height:      Weight:      SpO2: 100% 99%  100%   Weight change: -4 lb 14.2 oz (-2.218 kg)  Intake/Output Summary (Last 24 hours) at 08/07/15 1338 Last data filed at 08/07/15 0900  Gross per 24 hour  Intake    300 ml  Output      0 ml  Net    300 ml   General: resting in bed HEENT: [place]/AT Cardiac: RRR, +2/6 systolic murmur, no rubs or gallops Pulm: clear to auscultation bilaterally, moving normal volumes of air Abd: soft, nontender, nondistended, BS present Ext: warm and well perfused, no pedal edema Neuro: alert and oriented X3, responding appropriately, MAE spontaneously  Lab Results:  CBC:  Recent Labs Lab 08/06/15 1730 08/07/15 0337  WBC 6.2 7.8  HGB 7.8* 7.8*  HCT 24.4* 24.5*  MCV 79.0 78.3  PLT 141* 147*    Medications: I have reviewed the patient's current medications. Scheduled Meds: . allopurinol  100 mg Oral Daily  . carvedilol  25 mg Oral BID WC  . hydrALAZINE  25 mg Oral TID  . pantoprazole (PROTONIX) IV  40 mg Intravenous Q12H  . polyethylene glycol-electrolytes  4,000 mL Oral Once  . sodium chloride  3 mL Intravenous Q12H  . sodium chloride  3 mL Intravenous Q12H   Continuous Infusions: . sodium chloride     PRN Meds:.albuterol  Assessment/Plan: 79 year old male with PMH of diastolic dysfunction, HTN, CKD3, AS and diverticulosis here presenting with dyspnea and found to have + FOBT.  Acute blood loss anemia 2/2 to GI bleed: Hgb 5.4 --> 7.8 s/p 2 units of PRBCs on 08/07 (day of admission).  Reports stool is less dark this AM and no other blood loss noted.  His  symptoms of dyspnea and fatigue have resolved.  He was able to ambulate with PT w/o problem.  GI note reveals one hyperplastic polyp, diverticulosis and medium-sized internal hemorrhoids were noted on colonoscopy in 2011.  Given findings from that study and intermittent BRBPR this could be diverticular.  However, BRBPR has been intermittent for quite some time and the dark stools have been occuring for about 2 months, which coincides with the acute drop in hgb (hgb 11.4 in May 2016).  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  GI recommendations and intervention appreciated.  EGD this AM did not reveal source of bleeding.  We discussed reasoning for colonoscopy tomorrow with daughter translating for the patient.  Family and patient are in agreement for colonoscopy tomorrow.  They understand that colonoscopy may or may not reveal etiology and that capsule endoscopy may be needed depending upon GI recommendation. - he is very stable so will transfer to med-surg - hgb stable so will decrease Hgb checks to qday and transfuse again if hgb < 7 - clear liquid diet for now while he is prepping for colonoscopy tomorrow - NPO at MN for colonoscopy tomorrow - continue  IV protonix BID - continue to hold ASA - monitor for further blood loss and stat CBC if blood loss noted  HTN: Continue home Coreg and hydral  CKD3: stable  Diastolic dysfunction, grade 1: stable; EF 65-70% May 2016. DOE likely due to blood loss as above since his symptoms have resolved with transfusion.   Dispo: Disposition is deferred at this time, awaiting improvement of current medical problems.  Anticipated discharge in approximately 1-2 day(s).   The patient does have a current PCP (Doris Cheadle, MD) and does not need an [facility] hospital follow-up appointment after discharge.  The patient does not know have transportation limitations that hinder transportation to clinic  appointments.  .Services Needed at time of discharge: Y = Yes, Blank = No PT:   OT:   RN:   Equipment:   Other:     LOS: 2 days   Carlos Manges, DO 08/07/2015, 1:38 PM

## 2015-08-07 NOTE — H&P (View-Only) (Signed)
Referring Provider: Dr. Josem Kaufmann Primary Care Physician:  Doris Cheadle, MD Primary Gastroenterologist:  Gentry Fitz  Reason for Consultation:  GI bleed  HPI: [name] is a 79 y.o. male with several week history of black stools and one episode of red blood per rectum about 2 weeks ago. Denies abdominal pain, N/V. Hgb 5.4 on admit (down from 11.4 at the end of May). Hgb 7.6 today s/p 2 U PRBCs. Has felt lightheaded prior to admit. Denies alcohol. Denies iron pills or Pepto-Bismol. Denies constipation. Colonoscopy in 2011 showed diffuse diverticulosis, medium-sized internal hemorrhoids and a hyperplastic polyp. EGD in 2010 showed mild GEJ inflammation. Denies NSAIDs. His daughter and son provide translation for him.  Past Medical History  Diagnosis Date  . Hypertension   . Gouty arthritis of toe of right foot   . GERD (gastroesophageal reflux disease)   . Shortness of breath dyspnea   . BPH (benign prostatic hyperplasia)     Past Surgical History  Procedure Laterality Date  . Prostatectomy      Prior to Admission medications   Medication Sig Start Date End Date Taking? Authorizing Provider  albuterol (PROVENTIL HFA;VENTOLIN HFA) 108 (90 BASE) MCG/ACT inhaler Inhale 2 puffs into the lungs every 6 (six) hours as needed for wheezing or shortness of breath. 07/30/15  Yes Doris Cheadle, MD  allopurinol (ZYLOPRIM) 100 MG tablet Take 1 tablet (100 mg total) by mouth daily. 06/20/15  Yes Doris Cheadle, MD  aspirin 81 MG EC tablet Take 1 tablet (81 mg total) by mouth daily. 07/20/15  Yes Doris Cheadle, MD  atorvastatin (LIPITOR) 40 MG tablet Take 1 tablet (40 mg total) by mouth daily at 6 PM. Patient taking differently: Take 40 mg by mouth daily.  07/20/15  Yes Doris Cheadle, MD  carvedilol (COREG) 25 MG tablet Take 1 tablet (25 mg total) by mouth 2 (two) times daily with a meal. 07/20/15  Yes Deepak Advani, MD  hydrALAZINE (APRESOLINE) 25 MG tablet Take 1 tablet (25 mg total) by mouth 3 (three) times  daily. 07/20/15  Yes Doris Cheadle, MD  meclizine (ANTIVERT) 25 MG tablet Take 12.5 mg by mouth 3 (three) times daily as needed for dizziness.   Yes Historical Provider, MD  azithromycin (ZITHROMAX Z-PAK) 250 MG tablet Take as directed Patient not taking: Reported on 08/05/2015 07/30/15   Doris Cheadle, MD  colchicine 0.6 MG tablet Take 1 tablet (0.6 mg total) by mouth 2 (two) times daily. Patient not taking: Reported on 08/05/2015 07/20/15   Doris Cheadle, MD  isosorbide mononitrate (IMDUR) 30 MG 24 hr tablet Take 1 tablet (30 mg total) by mouth daily. Patient not taking: Reported on 08/05/2015 07/20/15   Doris Cheadle, MD  meclizine (ANTIVERT) 12.5 MG tablet Take 1 tablet (12.5 mg total) by mouth 3 (three) times daily as needed for nausea. Patient not taking: Reported on 08/05/2015 07/30/15   Doris Cheadle, MD    Scheduled Meds: . allopurinol  100 mg Oral Daily  . carvedilol  25 mg Oral BID WC  . hydrALAZINE  25 mg Oral TID  . pantoprazole (PROTONIX) IV  40 mg Intravenous Q12H  . sodium chloride  3 mL Intravenous Q12H  . sodium chloride  3 mL Intravenous Q12H   Continuous Infusions:  PRN Meds:.albuterol  Allergies as of 08/05/2015  . (No Known Allergies)    Family History  Problem Relation Age of Onset  . Hypertension Father   . Hypertension Sister   . Hypertension Brother     History  Social History  . Marital Status: Married    Spouse Name: N/A  . Number of Children: N/A  . Years of Education: N/A   Occupational History  . Not on file.   Social History Main Topics  . Smoking status: Never Smoker   . Smokeless tobacco: Not on file  . Alcohol Use: No  . Drug Use: No  . Sexual Activity: Not on file   Other Topics Concern  . Not on file   Social History Narrative    Review of Systems: All negative except as stated above in HPI.  Physical Exam: Vital signs: Filed Vitals:   08/06/15 1112  BP: 164/62  Pulse: 73  Temp: 98.1 F (36.7 C)  Resp: 19   Last BM Date:  08/05/15 General:  Elderly, Alert,  Thin, pleasant and cooperative in NAD HEENT: anicteric, oropharynx clear Lungs:  Clear throughout to auscultation.   No wheezes, crackles, or rhonchi. No acute distress. Heart:  Regular rate and rhythm; no murmurs, clicks, rubs,  or gallops. Abdomen: soft, nontender, nondistended, +BS  Rectal:  Deferred Ext: pulses intact, no edema  GI:  Lab Results:  Recent Labs  08/05/15 1418 08/06/15 0213 08/06/15 0540  WBC 5.2 5.8 6.1  HGB 5.4* 7.6* 7.8*  HCT 17.5* 23.3* 23.9*  PLT 155 136* 141*   BMET  Recent Labs  08/05/15 1418 08/06/15 0540  NA 139 140  K 4.3 3.9  CL 109 109  CO2 24 23  GLUCOSE 92 86  BUN 27* 27*  CREATININE 1.56* 1.61*  CALCIUM 9.0 8.5*   LFT  Recent Labs  08/06/15 0540  PROT 6.1*  ALBUMIN 3.1*  AST 16  ALT 24  ALKPHOS 115  BILITOT 0.9   PT/INR No results for input(s): LABPROT, INR in the last 72 hours.   Studies/Results: Dg Chest Port 1 View  08/05/2015   CLINICAL DATA:  Pt's daughter states the pt has been experiencing dizziness, SOB, and facial swelling x 1 day. Hx of HTN.  EXAM: PORTABLE CHEST - 1 VIEW  COMPARISON:  07/30/2015  FINDINGS: Cardiac silhouette is mildly enlarged. Normal mediastinal and hilar contours.  Lungs are hyperexpanded but clear. No pleural effusion or pneumothorax.  Bony thorax is demineralized. Old healed fracture of the right midclavicle is stable.  IMPRESSION: No acute cardiopulmonary disease.   Electronically Signed   By: Amie Portland M.D.   On: 08/05/2015 16:28    Impression/Plan: 79 yo with a 6 g drop in Hgb in the past 2 months with melena for several weeks concerning for an upper GI source such as peptic ulcer disease or an AVM. Maligancy also possible. Doubt colonic source and suspect one episode of hematochezia was due to hemorrhoids. EGD tomorrow morning to further evaluate. Clear liquids today. NPO p MN. Continue Protonix 40 mg IV Q 12 hours. Thank you for this  consultation.    LOS: 1 day   [name]  08/06/2015, 12:36 PM  Pager [phone]  If no answer or after 5 PM call [phone]

## 2015-08-07 NOTE — Op Note (Signed)
[facility] [address] [place], [address]   ENDOSCOPY PROCEDURE REPORT  PATIENT: [name], [name]  MR#: [id number] BIRTHDATE: 10/16/1936 , 79  yrs. old GENDER: male ENDOSCOPIST: Charlott Rakes, MD REFERRED BY:  hospital team PROCEDURE DATE:  08/20/2015 PROCEDURE:  EGD, diagnostic ASA CLASS:     Class III INDICATIONS:  acute post hemorrhagic anemia and melena. MEDICATIONS: Fentanyl 25 mcg IV and Versed 2 mg IV TOPICAL ANESTHETIC: Cetacaine Spray  DESCRIPTION OF PROCEDURE: After the risks benefits and alternatives of the procedure were thoroughly explained, informed consent was obtained.  The Pentax Gastroscope [id number] endoscope was introduced through the mouth and advanced to the second portion of the duodenum , Without limitations.  The instrument was slowly withdrawn as the mucosa was fully examined. Estimated blood loss is zero unless otherwise noted in this procedure report.    Esophagus normal and GEJ normal. Z-line 42 cm from the incisors. Stomach normal in appearance. Minimal patchy erythema in the duodenal bulb. 2nd portion of the duodenum normal in appearance. Retroflexed views revealed no abnormalities.  No blood products seen during the EGD.   The scope was then withdrawn from the patient and the procedure completed.  COMPLICATIONS: There were no immediate complications.  ENDOSCOPIC IMPRESSION:     Minimal duodenitis otherwise normal EGD No source of anemia seen  RECOMMENDATIONS:     Colonoscopy; Supportive care   eSigned:  Charlott Rakes, MD 20-Aug-2015 8:06 AM    CC:  CPT CODES: ICD CODES:  The ICD and CPT codes recommended by this software are interpretations from the data that the clinical staff has captured with the software.  The verification of the translation of this report to the ICD and CPT codes and modifiers is the sole responsibility of the health care institution and practicing physician where this report  was generated.  [employer] will not be held responsible for the validity of the ICD and CPT codes included on this report.  AMA assumes no liability for data contained or not contained herein. CPT is a Publishing rights manager of the [employer].  PATIENT NAME:  Samael, Blades MR#: [id number]

## 2015-08-07 NOTE — Interval H&P Note (Signed)
History and Physical Interval Note:  08/07/2015 7:35 AM  [name]  has presented today for surgery, with the diagnosis of melana  The various methods of treatment have been discussed with the patient and family. After consideration of risks, benefits and other options for treatment, the patient has consented to  Procedure(s): ESOPHAGOGASTRODUODENOSCOPY (EGD) WITH PROPOFOL (N/A) as a surgical intervention .  The patient's history has been reviewed, patient examined, no change in status, stable for surgery.  I have reviewed the patient's chart and labs.  Questions were answered to the patient's satisfaction.     [name]

## 2015-08-08 ENCOUNTER — Ambulatory Visit: Payer: No Typology Code available for payment source | Admitting: Cardiology

## 2015-08-08 ENCOUNTER — Encounter (HOSPITAL_COMMUNITY)
Admission: EM | Disposition: A | Discharge status: Home / Self Care | Payer: Self-pay | Source: Home / Self Care | Attending: Internal Medicine

## 2015-08-08 ENCOUNTER — Encounter (HOSPITAL_COMMUNITY): Payer: Self-pay

## 2015-08-08 HISTORY — PX: COLONOSCOPY: SHX5424

## 2015-08-08 LAB — CBC
HCT: 24.8 % — ABNORMAL LOW (ref 39.0–52.0)
HEMOGLOBIN: 8 g/dL — AB (ref 13.0–17.0)
MCH: 25.8 pg — AB (ref 26.0–34.0)
MCHC: 32.3 g/dL (ref 30.0–36.0)
MCV: 80 fL (ref 78.0–100.0)
PLATELETS: 143 10*3/uL — AB (ref 150–400)
RBC: 3.1 MIL/uL — AB (ref 4.22–5.81)
RDW: 17.7 % — ABNORMAL HIGH (ref 11.5–15.5)
WBC: 6.3 10*3/uL (ref 4.0–10.5)

## 2015-08-08 SURGERY — COLONOSCOPY
Anesthesia: Moderate Sedation

## 2015-08-08 MED ORDER — MIDAZOLAM HCL 5 MG/5ML IJ SOLN
INTRAMUSCULAR | Status: DC | PRN
  Administered 2015-08-08: 1 mg via INTRAVENOUS
  Administered 2015-08-08 (×2): 2 mg via INTRAVENOUS

## 2015-08-08 MED ORDER — DIPHENHYDRAMINE HCL 50 MG/ML IJ SOLN
INTRAMUSCULAR | Status: AC
  Filled 2015-08-08: qty 1

## 2015-08-08 MED ORDER — FENTANYL CITRATE (PF) 100 MCG/2ML IJ SOLN
INTRAMUSCULAR | Status: AC
  Filled 2015-08-08: qty 2

## 2015-08-08 MED ORDER — MIDAZOLAM HCL 5 MG/ML IJ SOLN
INTRAMUSCULAR | Status: AC
  Filled 2015-08-08: qty 2

## 2015-08-08 MED ORDER — FENTANYL CITRATE (PF) 100 MCG/2ML IJ SOLN
INTRAMUSCULAR | Status: DC | PRN
  Administered 2015-08-08 (×2): 25 ug via INTRAVENOUS

## 2015-08-08 MED ORDER — ACETAMINOPHEN 325 MG PO TABS
650.0000 mg | ORAL_TABLET | Freq: Once | ORAL | Status: DC
  Filled 2015-08-08: qty 2

## 2015-08-08 NOTE — Progress Notes (Signed)
Subjective: [name] was evaluated on rounds this morning.  Denies SOB or dizziness currently.  Patient tolerated bowel prep yesterday for colonoscopy today.  Slight bright red blood was noted on hospital bed linens yesterday.  Sheets were changed and small amount of bright red blood was noted this morning per son's report.  Patient denies chest pain, abdominal pain, muscle weakness/pain, or peripheral edema.      Objective: Vital signs in last 24 hours: Filed Vitals:   08/08/15 0114 08/08/15 0442 08/08/15 0534 08/08/15 1006  BP: 173/64  161/64 162/63  Pulse: 84  86 88  Temp: 98.7 F (37.1 C)  98.6 F (37 C) 98.2 F (36.8 C)  TempSrc: Oral  Oral Oral  Resp: 20  18 20   Height:      Weight:  63.8 kg (140 lb 10.5 oz)    SpO2: 98%  100% 100%   Weight change: 0.7 kg (1 lb 8.7 oz) No intake or output data in the 24 hours ending 08/08/15 1116 General: resting in bed HEENT: PERRL, EOMI, no scleral icterus Cardiac: RRR, no rubs, Aortic Stenosis (not new) or gallops Pulm: clear to auscultation bilaterally, moving normal volumes of air Abd: soft, nontender, nondistended, BS present Ext: warm and well perfused, no pedal edema Neuro: alert and oriented X3  Lab Results: Lab Results  Component Value Date   WBC 6.3 08/08/2015   HGB 8.0* 08/08/2015   HCT 24.8* 08/08/2015   MCV 80.0 08/08/2015   PLT 143* 08/08/2015     Micro Results: Recent Results (from the past 240 hour(s))  MRSA PCR Screening     Status: None   Collection Time: 08/05/15  8:39 PM  Result Value Ref Range Status   MRSA by PCR NEGATIVE NEGATIVE Final    Comment:        The GeneXpert MRSA Assay (FDA approved for NASAL specimens only), is one component of a comprehensive MRSA colonization surveillance program. It is not intended to diagnose MRSA infection nor to guide or monitor treatment for MRSA infections.    Studies/Results: No results found. Medications: I have reviewed the patient's current  medications. Scheduled Meds: . acetaminophen  650 mg Oral Once  . allopurinol  100 mg Oral Daily  . carvedilol  25 mg Oral BID WC  . hydrALAZINE  25 mg Oral TID  . pantoprazole (PROTONIX) IV  40 mg Intravenous Q12H  . sodium chloride  3 mL Intravenous Q12H   Continuous Infusions: . sodium chloride 10 mL/hr at 08/08/15 0600   PRN Meds:.albuterol Assessment/Plan: Principal Problem:  Acute blood loss anemia: Likely causing his SOB on admission. HgB is currently stable at 8.0. Patient had a Hgb of 5.4 on arrival and previous Hgb was 11.4 at the end of May. Patient had bowel prep yesterday and awaiting colonoscopy for today.  Slight amount of bright red blood seen on hospital bed linens last night and this morning.  The BRBPR noticed in the last 24 hours may have been induced by the bowel prep.  Because patient has a hx of diverticular disease, diverticulosis is high on the differential for the cause of GI bleed.  If colonoscopy does not see source for active bleed (diverticulosis vs AVMs) consider doing a capsule endoscopy outpatient to consider  source of GI bleed (possible AVMs) in the small bowel.  And with a hx of Moderate Aortic Stenosis Heyde's syndrome may be considered if angiodysplasia found.   - Continue Protonix 40 mg IV BID - Dr. Bosie Clos will  perform Colonoscopy today - NPO  Active Problems:   Essential hypertension: Last 3 BP: 173/64, 161/64, 162/63 - Continue Coreg and Hydralazine  - Consider changing dosage of Coreg and hydralazine to help better control BP    CKD (chronic kidney disease): Stable at baseline - Since patient was at baseline on arrival to ED and after 1 day BMET is no longer being ordered.   This is a Psychologist, occupational Note.  The care of the patient was discussed with Dr. Andrey Campanile and the assessment and plan formulated with their assistance.  Please see their attached note for official documentation of the daily encounter.   LOS: 3 days   [name], Med Student 08/08/2015, 11:16 AM

## 2015-08-08 NOTE — Progress Notes (Signed)
Physical Therapy Treatment and Discharge Patient Details Name: [name] MRN: [id number] DOB: 03-Sep-1936 Today's Date: 08/08/2015    History of Present Illness Pt adm with severe anemia with suspected GI bleed. PMH - HTN    PT Comments    Pt has met all PT goals. Pt very egar to return home. Pt at minimal fall risk as demo by score of 21 on the DGI today. Family member was present today to help translate therapist instructions during therapy session. From a mobility standpoint pt is safe to be DC home once medically cleared. Pt functioning at Mod I safely. Pt with no further acute PT needs at this time. PT SIGNING OFF. Please reconsult if needed in future.   Follow Up Recommendations  No PT follow up     Equipment Recommendations  None recommended by PT    Recommendations for Other Services       Precautions / Restrictions Precautions Precautions: None    Mobility  Bed Mobility Overal bed mobility: Independent                Transfers Overall transfer level: Independent Equipment used: None Transfers: Sit to/from Stand Sit to Stand: Independent         General transfer comment: pt able to stand safely without physical assist from therapist  Ambulation/Gait Ambulation/Gait assistance: Modified independent (Device/Increase time) Ambulation Distance (Feet): 510 Feet Assistive device: None Gait Pattern/deviations: Step-through pattern;Narrow base of support Gait velocity: normal Gait velocity interpretation: at or above normal speed for age/gender General Gait Details: pt steady with gt, challenged pt by doing DGI, light unsteainess when preforming DGI however able to self correct   Stairs Stairs: Yes Stairs assistance: Modified independent (Device/Increase time) Stair Management: No rails;Forwards;Alternating pattern Number of Stairs: 3 General stair comments: able to negotiate stair without railing  Wheelchair Mobility    Modified Rankin (Stroke  Patients Only)       Balance Overall balance assessment: Independent Sitting-balance support: No upper extremity supported;Feet supported Sitting balance-Leahy Scale: Good     Standing balance support: No upper extremity supported;During functional activity Standing balance-Leahy Scale: Good                      Cognition Arousal/Alertness: Awake/alert Behavior During Therapy: WFL for tasks assessed/performed Overall Cognitive Status: Within Functional Limits for tasks assessed                      Exercises      General Comments General comments (skin integrity, edema, etc.): pt does not speak english however pt family memeber was present to translate       Pertinent Vitals/Pain Pain Assessment: No/denies pain    Home Living                      Prior Function            PT Goals (current goals can now be found in the care plan section) Acute Rehab PT Goals Patient Stated Goal: wants to go home Progress towards PT goals: Progressing toward goals    Frequency  Min 3X/week    PT Plan Other (comment) (pt no longer requires acute PT services)    Co-evaluation             End of Session Equipment Utilized During Treatment: Gait belt Activity Tolerance: Patient tolerated treatment well Patient left: in chair;with call bell/phone within reach;with family/visitor present  Time: 1840-3754 PT Time Calculation (min) (ACUTE ONLY): 14 min  Charges:  $Gait Training: 8-22 mins                    G Codes:      Renaee Munda 08/17/2015, 12:52 PM   Renaee Munda, Dayle Points (student physical therapy assistant) Acute Rehab [phone]

## 2015-08-08 NOTE — Progress Notes (Signed)
   Subjective: [name] was seen and examined this AM.  He continues to feel improved, specifically denying chest pain, dyspnea.  Small amount of blood was noted on bed pad yesterday after full day of bowel prep, no more blood noted this AM.  Stools have been watery, clear with bowel prep.    Objective: Vital signs in last 24 hours: Filed Vitals:   08/08/15 0114 08/08/15 0442 08/08/15 0534 08/08/15 1006  BP: 173/64  161/64 162/63  Pulse: 84  86 88  Temp: 98.7 F (37.1 C)  98.6 F (37 C) 98.2 F (36.8 C)  TempSrc: Oral  Oral Oral  Resp: Height:      Weight:  140 lb 10.5 oz (63.8 kg)    SpO2: 98%  100% 100%   Weight change: 1 lb 8.7 oz (0.7 kg) No intake or output data in the 24 hours ending 08/08/15 1344 General: resting in bed in NAD HEENT: /AT Cardiac: RRR, +2/6 systolic murmur, no rubs or gallops Pulm: clear to auscultation bilaterally, moving normal volumes of air Abd: soft, nontender, nondistended, BS present Ext: warm and well perfused, no pedal edema Neuro: alert and oriented X3, responding appropriately, MAE spontaneously  Lab Results:  CBC:  Recent Labs Lab 08/07/15 1712 08/08/15 0541  WBC 8.1 6.3  HGB 8.8* 8.0*  HCT 27.9* 24.8*  MCV 80.4 80.0  PLT 159 143*    Medications: I have reviewed the patient's current medications. Scheduled Meds: . acetaminophen  650 mg Oral Once  . allopurinol  100 mg Oral Daily  . carvedilol  25 mg Oral BID WC  . hydrALAZINE  25 mg Oral TID  . pantoprazole (PROTONIX) IV  40 mg Intravenous Q12H  . sodium chloride  3 mL Intravenous Q12H   Continuous Infusions: . sodium chloride 10 mL/hr at 08/08/15 0600   PRN Meds:.albuterol  Assessment/Plan: 79 year old male with PMH of diastolic dysfunction, HTN, CKD3, AS and diverticulosis here presenting with dyspnea and found to have + FOBT.    Acute blood loss anemia 2/2 to GI bleed: Hgb stable in low 8s s/p transfusion of 2 units PRBCs on hospital day 1.  Small amount  of blood on padding last night but none since.    Appreciate GI recommendations and intervention.  EGD did not reveal source of blood loss.  Plan for colonoscopy today. - continue Hgb checks qday and transfuse again if hgb < 7 - NPO for colonoscopy today - continue IV protonix BID - continue to hold ASA - monitor for further blood loss and stat CBC if significant blood loss noted  ZOX:[id number] elevated.  Will Continue home Coreg and hydral and increase if needed.  CKD3: stable  Diastolic dysfunction, grade 1: stable; EF 65-70% May 2016. DOE likely due to blood loss as above since his symptoms have resolved with transfusion.   Dispo: Disposition is deferred at this time, awaiting improvement of current medical problems.  Anticipated discharge in approximately 0-1 day(s).    The patient does have a current PCP (Doris Cheadle, MD) and does not need an [facility] hospital follow-up appointment after discharge.  The patient does not know have transportation limitations that hinder transportation to clinic appointments.  .Services Needed at time of discharge: Y = Yes, Blank = No PT:   OT:   RN:   Equipment:   Other:     LOS: 3 days   Yolanda Manges, DO 08/08/2015, 1:44 PM

## 2015-08-08 NOTE — Op Note (Signed)
[facility] [address] [place], [address]   COLONOSCOPY PROCEDURE REPORT     EXAM DATE: 2015/08/30  PATIENT NAME:      Quinzell, [name]           MR #:      [id number] BIRTHDATE:       12-23-1936      VISIT #:     [phone]  ATTENDING:     Wandalee Ferdinand, MD     STATUS:     inpatient ASSISTANT:      Nilsa Nutting and Valera [place]  INDICATIONS:  The patient is a 79 yr old male here for a colonoscopy due to  GI bleeding PROCEDURE PERFORMED:      colonoscopy MEDICATIONS:      fentanyl 50 g IV , Versed 5 mg IV ESTIMATED BLOOD LOSS:     None  CONSENT: The patient understands the risks and benefits of the procedure and understands that these risks include, but are not limited to: sedation, allergic reaction, infection, perforation and/or bleeding. Alternative means of evaluation and treatment include, among others: physical exam, x-rays, and/or surgical intervention. The patient elects to proceed with this endoscopic procedure.  DESCRIPTION OF PROCEDURE: During intra-op preparation period all mechanical & medical equipment was checked for proper function. Hand hygiene and appropriate measures for infection prevention was taken. After the risks, benefits and alternatives of the procedure were thoroughly explained, Informed consent was verified, confirmed and timeout was successfully executed by the treatment team. A digital exam  was normal.      The Pentax Ped Colon [id number] endoscope was introduced through the anus and advanced to the  cecum     .  the colon prep was adequate.      The instrument was then slowly withdrawn as the colon was fully examined.Estimated blood loss is zero unless otherwise noted in this procedure report. The scope was then completely withdrawn from the patient and the procedure terminated. SCOPE WITHDRAWAL TIME:  findings: the cecum and ascending colon were normal. The transverse colon was normal. The descending  colon sigmoid and rectum were normal. Upon retroflexion internal hemorrhoids were seen.    ADVERSE EVENTS:      There were no immediate complications.  IMPRESSIONS:      normal colonoscopy to the cecum with internal hemorrhoids  RECOMMENDATIONS: RECALL:  _____________________________ Wandalee Ferdinand, MD eSigned:  Wandalee Ferdinand, MD 2015/08/30 3:05 PM   cc:   CPT CODES: ICD CODES:  The ICD and CPT codes recommended by this software are interpretations from the data that the clinical staff has captured with the software.  The verification of the translation of this report to the ICD and CPT codes and modifiers is the sole responsibility of the health care institution and practicing physician where this report was generated.  [employer] will not be held responsible for the validity of the ICD and CPT codes included on this report.  AMA assumes no liability for data contained or not contained herein. CPT is a Publishing rights manager of the [employer].

## 2015-08-09 ENCOUNTER — Encounter (HOSPITAL_COMMUNITY): Payer: Self-pay | Admitting: Gastroenterology

## 2015-08-09 LAB — CBC
HCT: 24.4 % — ABNORMAL LOW (ref 39.0–52.0)
HEMATOCRIT: 23.7 % — AB (ref 39.0–52.0)
Hemoglobin: 7.5 g/dL — ABNORMAL LOW (ref 13.0–17.0)
Hemoglobin: 7.6 g/dL — ABNORMAL LOW (ref 13.0–17.0)
MCH: 25.4 pg — ABNORMAL LOW (ref 26.0–34.0)
MCH: 24.6 pg — AB (ref 26.0–34.0)
MCHC: 31.6 g/dL (ref 30.0–36.0)
MCHC: 31.1 g/dL (ref 30.0–36.0)
MCV: 80.3 fL (ref 78.0–100.0)
MCV: 79 fL (ref 78.0–100.0)
PLATELETS: 129 10*3/uL — AB (ref 150–400)
PLATELETS: 143 10*3/uL — AB (ref 150–400)
RBC: 2.95 MIL/uL — ABNORMAL LOW (ref 4.22–5.81)
RBC: 3.09 MIL/uL — ABNORMAL LOW (ref 4.22–5.81)
RDW: 17.8 % — ABNORMAL HIGH (ref 11.5–15.5)
RDW: 17.9 % — ABNORMAL HIGH (ref 11.5–15.5)
WBC: 5.9 10*3/uL (ref 4.0–10.5)
WBC: 5.7 10*3/uL (ref 4.0–10.5)

## 2015-08-09 LAB — SAVE SMEAR

## 2015-08-09 NOTE — Progress Notes (Signed)
Subjective: [name] was seen and examined this AM.  He feels well, no blood loss, CP or dyspnea.   Objective: Vital signs in last 24 hours: Filed Vitals:   08/08/15 1802 08/08/15 2108 08/09/15 0143 08/09/15 0602  BP: 143/54 139/52 165/73 167/74  Pulse: 89 80 84 82  Temp: 97.9 F (36.6 C) 98 F (36.7 C) 97.9 F (36.6 C) 98.1 F (36.7 C)  TempSrc: Oral Oral Oral Oral  Resp: Height:      Weight:      SpO2: 99% 100% 99% 97%   Weight change: -10.5 oz (-0.297 kg) No intake or output data in the 24 hours ending 08/09/15 0745 General: resting in bed in NAD  HEENT: [place]/AT Cardiac: RRR, +2/6 systolic murmur, no rubs or gallops Pulm: clear to auscultation bilaterally, moving normal volumes of air Abd: soft, nontender, nondistended, BS present Ext: warm and well perfused, no pedal edema Neuro: alert and oriented X3, responding appropriately, MAE spontaneously  Lab Results:  CBC:  Recent Labs Lab 08/08/15 0541 08/09/15 0635  WBC 6.3 5.9  HGB 8.0* 7.5*  HCT 24.8* 23.7*  MCV 80.0 80.3  PLT 143* 129*    Medications: I have reviewed the patient's current medications. Scheduled Meds: . acetaminophen  650 mg Oral Once  . allopurinol  100 mg Oral Daily  . carvedilol  25 mg Oral BID WC  . hydrALAZINE  25 mg Oral TID  . pantoprazole (PROTONIX) IV  40 mg Intravenous Q12H  . sodium chloride  3 mL Intravenous Q12H   Continuous Infusions: . sodium chloride 10 mL/hr at 08/08/15 0600   PRN Meds:.albuterol  Assessment/Plan: 79 year old male with PMH of diastolic dysfunction, HTN, CKD3, AS and diverticulosis here presenting with dyspnea and found to have + FOBT.    Acute blood loss anemia 2/2 to GI bleed: Appreciate GI recommendations and intervention.  EGD and colonoscopy did not reveal source of blood loss.  GI recommends continued work-up with capsule endoscopy as an outpatient.  Hgb down to 7.5 this AM.   - Will check for hgb stability this afternoon.  If stable  at 7.5 or increasing, can safely d/c him home with close PCP and GI follow-up. - he is asymptomatic at rest but has not walked around much, we will have him ambulate and if symptomatic may require transfusion despite hgb > 7 - continue Hgb checks qday and transfuse again if hgb < 7 or symptomatic - continue IV protonix BID --> po at discharge - continue to hold ASA - monitor for further blood loss and stat CBC if significant blood loss noted  QMV:HQIONGEX elevated.  Will Continue home Coreg and hydral and increase if needed.  CKD3: stable  Diastolic dysfunction, grade 1: stable; EF 65-70% May 2016. DOE likely due to blood loss as above since his symptoms have resolved with transfusion.   Dispo: Disposition is deferred at this time, awaiting improvement of current medical problems.  Anticipated discharge in approximately 0-1 day(s).    The patient does have a current PCP (Carlos Cheadle, MD) and does not need an [facility] hospital follow-up appointment after discharge.  The patient does not know have transportation limitations that hinder transportation to clinic appointments.  .Services Needed at time of discharge: Y = Yes, Blank = No PT:   OT:   RN:   Equipment:   Other:     LOS: 4 days   Carlos Manges, DO 08/09/2015, 7:45 AM

## 2015-08-09 NOTE — Progress Notes (Signed)
Patient ID: Demetrus Pavao, male   DOB: 1936-09-08, 79 y.o.   MRN: [id number] [facility] Progress Note  Nikholas Geffre 79 y.o. [birth year]   Subjective: Feels ok. Tolerating solid food. Denies abdominal pain. Family in room.  Objective: Vital signs in last 24 hours: Filed Vitals:   08/09/15 0602  BP: 167/74  Pulse: 82  Temp: 98.1 F (36.7 C)  Resp: 16    Physical Exam: Gen: alert, no acute distress HEENT: anicteric CV: RRR Chest: CTA B Abd: soft, nontender, nondistended, +BS   Lab Results: No results for input(s): NA, K, CL, CO2, GLUCOSE, BUN, CREATININE, CALCIUM, MG, PHOS in the last 72 hours. No results for input(s): AST, ALT, ALKPHOS, BILITOT, PROT, ALBUMIN in the last 72 hours.  Recent Labs  08/08/15 0541 08/09/15 0635  WBC 6.3 5.9  HGB 8.0* 7.5*  HCT 24.8* 23.7*  MCV 80.0 80.3  PLT 143* 129*   No results for input(s): LABPROT, INR in the last 72 hours.    Assessment/Plan: S/P GI bleed likely from AVMs but hemorrhoids contributing to hematochezia. Needs outpt capsule endoscopy. Set patient up to see surgery for management of his internal hemorrhoids, which is daughter says has caused problems for him over the past 7 years despite topical treatment. She denies that he has problems with constipation. Ok to go home from a GI standpoint. Will sign off. Call if questions.   Katilin Raynes C. 08/09/2015, 9:47 AM  Pager [phone]  If no answer or after 5 PM call [phone]

## 2015-08-09 NOTE — Progress Notes (Signed)
Subjective: [name] was evaluated on rounds this morning.  Patient had a colonoscopy yesterday 8/10 and reported no complications.  Patient reports GI suggests an outpatient capsule to assess the small bowel for bleeding due to a normal colonoscopy and endoscopy.  He has not had a bowel movement in the last 24 hrs.  He does not report seeing any blood on bed linens.  Patient has not tried to ambulate in the room since admission and was counsled on walking around his room to better assess for SOB and fatigue.  Patient denies N/V, SOB, chest pain, abdominal pain, fatigue or peripheral edema.    Objective: Vital signs in last 24 hours: Filed Vitals:   08/08/15 2108 08/09/15 0143 08/09/15 0602 08/09/15 0954  BP: 139/52 165/73 167/74 161/66  Pulse: 80 84 82 79  Temp: 98 F (36.7 C) 97.9 F (36.6 C) 98.1 F (36.7 C) 98.1 F (36.7 C)  TempSrc: Oral Oral Oral Oral  Resp: 16 17 16 16   Height:      Weight:      SpO2: 100% 99% 97% 100%   Weight change: -0.297 kg (-10.5 oz) No intake or output data in the 24 hours ending 08/09/15 1306 General: resting in bed HEENT: PERRL, EOMI, no scleral icterus Cardiac: RRR, Aortic Stenosis (not new) no rubs or gallops Pulm: clear to auscultation bilaterally, moving normal volumes of air Abd: soft, nontender, nondistended, BS present Ext: warm and well perfused, no pedal edema Neuro: alert and oriented X3,   Lab Results: Lab Results  Component Value Date   WBC 5.9 08/09/2015   HGB 7.5* 08/09/2015   HCT 23.7* 08/09/2015   MCV 80.3 08/09/2015   PLT 129* 08/09/2015   BMET    Component Value Date/Time   NA 140 08/06/2015 0540   K 3.9 08/06/2015 0540   CL 109 08/06/2015 0540   CO2 23 08/06/2015 0540   GLUCOSE 86 08/06/2015 0540   BUN 27* 08/06/2015 0540   CREATININE 1.61* 08/06/2015 0540   CREATININE 1.59* 06/07/2015 1440   CALCIUM 8.5* 08/06/2015 0540   GFRNONAA 40* 08/06/2015 0540   GFRAA 46* 08/06/2015 0540    Micro Results: Recent  Results (from the past 240 hour(s))  MRSA PCR Screening     Status: None   Collection Time: 08/05/15  8:39 PM  Result Value Ref Range Status   MRSA by PCR NEGATIVE NEGATIVE Final    Comment:        The GeneXpert MRSA Assay (FDA approved for NASAL specimens only), is one component of a comprehensive MRSA colonization surveillance program. It is not intended to diagnose MRSA infection nor to guide or monitor treatment for MRSA infections.    Studies/Results: No results found. Medications: I have reviewed the patient's current medications. Scheduled Meds: . acetaminophen  650 mg Oral Once  . allopurinol  100 mg Oral Daily  . carvedilol  25 mg Oral BID WC  . hydrALAZINE  25 mg Oral TID  . pantoprazole (PROTONIX) IV  40 mg Intravenous Q12H  . sodium chloride  3 mL Intravenous Q12H   Continuous Infusions: . sodium chloride 10 mL/hr at 08/08/15 0600   PRN Meds:.albuterol Assessment/Plan: Principal Problem:  Acute blood loss anemia: Likely causing his SOB on admission. HgB is currently 7.5, dropping from 8.0 on 8/10 Patient had a Hgb of 5.4 on arrival and previous Hgb was 11.4 at the end of May. Patient had a colonoscopy yesterday 8/10.  Colonoscopy was normal with internal hemorrhoids noted.  Since colonoscopy did not see a source for active bleed, per GI a capsule endoscopy can be considered outpatient to assess small bowel for potential bleeding site.  Will consider other causes of bleed such as hemolysis.  Patient reports starting hydralazine 3 months ago.  On month 2 patient started feeling fatigued.    Adverse hematological reactions with hydralazine include Agranulocytosis, eosinophilia, erythrocyte count reduced (currently 2.95), hemoglobin decreased (currently 7.5), hemolytic anemia, leukopenia, thrombocytopenia (rare) (currently 129)   Erythrocyte count and hemoglobin was below normal limits on admission and continued below normal during hospital stay.  Platelets on  admission were 155.  They have fluctuated during hospital stay in and out of normal limits (129-159).  This could also be explained by patients hx of CKD.  Patients WBC have fluctuated (5.8-8.1)  since admission however have not gone out of normal range.  Continue to monitor WBC for signs of leukopenia.   On previous admission 5/30 CBC showed:  WBC: 6.0, RBC: 4.36, Hgb: 11.4, Plt: 146 Currently considering hydralazine as suspect for anemia.  If lab results are neg for hemolysis then AVMs in the small bowel remain high on the differential for source of bleed.         - Continue Protonix 40 mg IV BID -  Will repeat cbc to see if Hgb was changed  -  Obtain haptoglobin levels to assess for hemolysis -  obtain a peripheral smear  Active Problems:   Essential hypertension: Last 3 BP:  165/73, 167/74, 161/66  Patients bp has not been well controlled with combo of Coreg and Hydralazine  - Continue Coreg  - Consider discontinuing Hydralazine for the potential hematologic adverse affects as noted above - consider adding a thiazide diuretic or CCB for better BP control     CKD (chronic kidney disease): Stable at baseline - Since patient was at baseline on arrival to ED and after 1 day BMET is no longer being ordered.    This is a Psychologist, occupational Note.  The care of the patient was discussed with Dr. Andrey Campanile and the assessment and plan formulated with their assistance.  Please see their attached note for official documentation of the daily encounter.   LOS: 4 days   Camelia Phenes, Med Student 08/09/2015, 1:06 PM

## 2015-08-10 DIAGNOSIS — K922 Gastrointestinal hemorrhage, unspecified: Secondary | ICD-10-CM

## 2015-08-10 DIAGNOSIS — D62 Acute posthemorrhagic anemia: Secondary | ICD-10-CM

## 2015-08-10 LAB — CBC
HCT: 22.9 % — ABNORMAL LOW (ref 39.0–52.0)
HCT: 25.5 % — ABNORMAL LOW (ref 39.0–52.0)
Hemoglobin: 7.2 g/dL — ABNORMAL LOW (ref 13.0–17.0)
Hemoglobin: 8 g/dL — ABNORMAL LOW (ref 13.0–17.0)
MCH: 25.1 pg — ABNORMAL LOW (ref 26.0–34.0)
MCH: 25 pg — ABNORMAL LOW (ref 26.0–34.0)
MCHC: 31.4 g/dL (ref 30.0–36.0)
MCHC: 31.4 g/dL (ref 30.0–36.0)
MCV: 79.8 fL (ref 78.0–100.0)
MCV: 79.7 fL (ref 78.0–100.0)
Platelets: 120 10*3/uL — ABNORMAL LOW (ref 150–400)
Platelets: 144 10*3/uL — ABNORMAL LOW (ref 150–400)
RBC: 2.87 MIL/uL — ABNORMAL LOW (ref 4.22–5.81)
RBC: 3.2 MIL/uL — AB (ref 4.22–5.81)
RDW: 17.9 % — AB (ref 11.5–15.5)
RDW: 17.9 % — ABNORMAL HIGH (ref 11.5–15.5)
WBC: 5.7 10*3/uL (ref 4.0–10.5)
WBC: 6.3 10*3/uL (ref 4.0–10.5)

## 2015-08-10 LAB — HAPTOGLOBIN: Haptoglobin: 270 mg/dL — ABNORMAL HIGH (ref 34–200)

## 2015-08-10 MED ORDER — PANTOPRAZOLE SODIUM 40 MG PO TBEC: 40.0000 mg | DELAYED_RELEASE_TABLET | Freq: Every day | ORAL | Status: DC

## 2015-08-10 NOTE — Progress Notes (Signed)
  Subjective: [name] was evaluated on rounds this morning.  Son was available for translation.  Patient reports walking for 35 minutes in the hallways of the hospital without SOB or dizziness.  Patient states he feels well.  Patient is eating well and drinking fluids.  Patient had a bowel movement since last evaluated and noted BRBPR after bathroom use when wiping.  Patient denies n/v, dizziness, SOB, abdominal pain, or peripheral edema.      Objective: Vital signs in last 24 hours: Filed Vitals:   08/10/15 0129 08/10/15 0534 08/10/15 0956 08/10/15 1353  BP: 140/54 141/60 146/59 165/61  Pulse: 79 79 80 79  Temp: 98.1 F (36.7 C) 98.5 F (36.9 C) 98.6 F (37 C) 98.4 F (36.9 C)  TempSrc: Oral Oral Oral Oral  Resp: Height:      Weight:      SpO2: 100% 100% 100% 100%   Weight change:  No intake or output data in the 24 hours ending 08/10/15 1446   General: resting in bed HEENT: PERRL, EOMI, no scleral icterus Cardiac: RRR, Aortic Stenosis (not new) no rubs or gallops Pulm: clear to auscultation bilaterally, moving normal volumes of air Abd: soft, nontender, nondistended, BS present Ext: warm and well perfused, no pedal edema Neuro: alert and oriented X3  Lab Results: Lab Results  Component Value Date   WBC 6.3 08/10/2015   HGB 8.0* 08/10/2015   HCT 25.5* 08/10/2015   MCV 79.7 08/10/2015   PLT 144* 08/10/2015    Micro Results: Recent Results (from the past 240 hour(s))  MRSA PCR Screening     Status: None   Collection Time: 08/05/15  8:39 PM  Result Value Ref Range Status   MRSA by PCR NEGATIVE NEGATIVE Final    Comment:        The GeneXpert MRSA Assay (FDA approved for NASAL specimens only), is one component of a comprehensive MRSA colonization surveillance program. It is not intended to diagnose MRSA infection nor to guide or monitor treatment for MRSA infections.    Studies/Results: No results found. Medications: I have reviewed the patient's  current medications. Scheduled Meds: . acetaminophen  650 mg Oral Once  . allopurinol  100 mg Oral Daily  . carvedilol  25 mg Oral BID WC  . hydrALAZINE  25 mg Oral TID  . pantoprazole (PROTONIX) IV  40 mg Intravenous Q12H  . sodium chloride  3 mL Intravenous Q12H   Continuous Infusions:  PRN Meds:.albuterol Assessment/Plan: Principal Problem:   Acute blood loss anemia:  Patient had a previous Hgb of 7.6 dropping to 7.2.  Patient was monitored and repeat  Hgb currently shows 8.0.  Haptoglobin levels did not show evidence of hemolysis. Patient reports feeling well and was able to ambulate the hallways of the hospital without difficulty.  With stable Hgb, clinical improvement and follow up appt with GI, patient is ready for discharge.       Active Problems: Essential hypertension:  Coreg and Hydralazine continued CKD (chronic kidney disease):  Stable at baseline    This is a Psychologist, occupational Note.  The care of the patient was discussed with Dr. Andrey Campanile and the assessment and plan formulated with their assistance.  Please see their attached note for official documentation of the daily encounter.   LOS: 5 days   Camelia Phenes, Med Student 08/10/2015, 2:46 PM

## 2015-08-10 NOTE — Progress Notes (Signed)
   Subjective: [name] was seen and examined this AM.  He feels well, no blood loss, CP or dyspnea.  He ambulated without problem.   Objective: Vital signs in last 24 hours: Filed Vitals:   08/09/15 2130 08/10/15 0129 08/10/15 0534 08/10/15 0956  BP: 154/65 140/54 141/60 146/59  Pulse: 82 79 79 80  Temp: 98.3 F (36.8 C) 98.1 F (36.7 C) 98.5 F (36.9 C) 98.6 F (37 C)  TempSrc: Oral Oral Oral Oral  Resp: Height:      Weight:      SpO2: 100% 100% 100% 100%   Weight change:  No intake or output data in the 24 hours ending 08/10/15 1125 General: resting in bed in NAD  HEENT: [place]/AT Cardiac: RRR, +2/6 systolic murmur, no rubs or gallops Pulm: clear to auscultation bilaterally, moving normal volumes of air Abd: soft, nontender, nondistended, BS present Ext: warm and well perfused, no pedal edema Neuro: alert and oriented X3, responding appropriately, MAE spontaneously  Lab Results:  CBC:  Recent Labs Lab 08/09/15 1500 08/10/15 0622  WBC 5.7 5.7  HGB 7.6* 7.2*  HCT 24.4* 22.9*  MCV 79.0 79.8  PLT 143* 120*    Medications: I have reviewed the patient's current medications. Scheduled Meds: . acetaminophen  650 mg Oral Once  . allopurinol  100 mg Oral Daily  . carvedilol  25 mg Oral BID WC  . hydrALAZINE  25 mg Oral TID  . pantoprazole (PROTONIX) IV  40 mg Intravenous Q12H  . sodium chloride  3 mL Intravenous Q12H   Continuous Infusions:  none   PRN Meds:.albuterol  Assessment/Plan: 79 year old male with PMH of diastolic dysfunction, HTN, CKD3, AS and diverticulosis here presenting with dyspnea and found to have + FOBT.    Acute blood loss anemia 2/2 to GI bleed: Appreciate GI recommendations and intervention.  No source of blood loss on scopes.  GI recommends capsule endoscopy as an outpatient.  Hgb stable at 8 and he is asymptomatic w/o rapid blood loss.  [facility] was contacted by primary team and they plan to call patient to arrange visit.  He is  scheduled for follow at [facility] on 08/23 (first available). - continue protonix po at discharge - continue to hold ASA until PCP/GI follow-up - return precautions given  [id number] home medications  CKD3: stable  Diastolic dysfunction, grade 1: stable; EF 65-70% May 2016.  Dispo: He is stable for d/c home with close PCP and GI follow-up.   The patient does have a current PCP (Carlos Cheadle, MD) and does not need an [facility] hospital follow-up appointment after discharge.  The patient does not know have transportation limitations that hinder transportation to clinic appointments.  .Services Needed at time of discharge: Y = Yes, Blank = No PT:   OT:   RN:   Equipment:   Other:     LOS: 5 days   Carlos Manges, DO 08/10/2015, 11:25 AM

## 2015-08-10 NOTE — Discharge Summary (Signed)
Name: [name] MRN: [id number] DOB: [birth year] 79 y.o. PCP: Doris Cheadle, MD  Date of Admission: 08/05/2015  2:43 PM Date of Discharge: 08/10/2015 Attending Physician: Doneen Poisson, MD  Discharge Diagnosis:   Acute blood loss anemia due to GI bleed   Essential hypertension   Chronic kidney disease  Discharge Medications:   Medication List    STOP taking these medications        aspirin 81 MG EC tablet     azithromycin 250 MG tablet  Commonly known as:  ZITHROMAX Z-PAK     meclizine 12.5 MG tablet  Commonly known as:  ANTIVERT     meclizine 25 MG tablet  Commonly known as:  ANTIVERT      TAKE these medications        albuterol 108 (90 BASE) MCG/ACT inhaler  Commonly known as:  PROVENTIL HFA;VENTOLIN HFA  Inhale 2 puffs into the lungs every 6 (six) hours as needed for wheezing or shortness of breath.     allopurinol 100 MG tablet  Commonly known as:  ZYLOPRIM  Take 1 tablet (100 mg total) by mouth daily.     atorvastatin 40 MG tablet  Commonly known as:  LIPITOR  Take 1 tablet (40 mg total) by mouth daily at 6 PM.     carvedilol 25 MG tablet  Commonly known as:  COREG  Take 1 tablet (25 mg total) by mouth 2 (two) times daily with a meal.     colchicine 0.6 MG tablet  Take 1 tablet (0.6 mg total) by mouth 2 (two) times daily.     hydrALAZINE 25 MG tablet  Commonly known as:  APRESOLINE  Take 1 tablet (25 mg total) by mouth 3 (three) times daily.     isosorbide mononitrate 30 MG 24 hr tablet  Commonly known as:  IMDUR  Take 1 tablet (30 mg total) by mouth daily.     pantoprazole 40 MG tablet  Commonly known as:  PROTONIX  Take 1 tablet (40 mg total) by mouth daily.        Disposition and follow-up:   Mr.[name] was discharged from [facility] in Good condition.  At the hospital follow up visit please address:  1.  Source of GI bleeding - he will require capsule endoscopy for further evaluation.  2.  Labs / imaging needed at  time of follow-up: CBC  3.  Pending labs/ test needing follow-up: none  Follow-up Appointments:  [facility] August 21, 2015 at [facility] for capsule endoscopy - the office will call the patient to arrange capsule endoscopy  Discharge Instructions:     Discharge Instructions    Call MD for:  difficulty breathing, headache or visual disturbances    Complete by:  As directed      Call MD for:  extreme fatigue    Complete by:  As directed      Call MD for:  hives    Complete by:  As directed      Call MD for:  persistant dizziness or light-headedness    Complete by:  As directed      Call MD for:  persistant nausea and vomiting    Complete by:  As directed      Call MD for:  redness, tenderness, or signs of infection (pain, swelling, redness, odor or green/yellow discharge around incision site)    Complete by:  As directed      Call MD  for:  severe uncontrolled pain    Complete by:  As directed      Call MD for:  temperature >100.4    Complete by:  As directed      Diet - low sodium heart healthy    Complete by:  As directed      Increase activity slowly    Complete by:  As directed            Consultations: [facility]  Imaging: Dg Chest Port 1 View  08/05/2015   CLINICAL DATA:  Pt's daughter states the pt has been experiencing dizziness, SOB, and facial swelling x 1 day. Hx of HTN.  EXAM: PORTABLE CHEST - 1 VIEW  COMPARISON:  07/30/2015  FINDINGS: Cardiac silhouette is mildly enlarged. Normal mediastinal and hilar contours.  Lungs are hyperexpanded but clear. No pleural effusion or pneumothorax.  Bony thorax is demineralized. Old healed fracture of the right midclavicle is stable.  IMPRESSION: No acute cardiopulmonary disease.   Electronically Signed   By: Amie Portland M.D.   On: 08/05/2015 16:28   Procedures Performed:  08/07/2015 EGD: Minimal duodenitis otherwise normal EGD No source of anemia seen  08/08/2015 Colonoscopy: Normal colonoscopy  to the cecum with internal hemorrhoids  Admission HPI: [name] is a 79 year old male with PMH of HTN, diastolic dysfunction, GERD and gout who presents with 2 weeks of DOE. He denies chest pain, PND, orthopnea. Upon questioning, he reports small amount of BRBPR every few months for several years. However, his stools have been darker for the past two months. He denies N/V or abdominal pain but feels his appetite has not been the best lately and he has some early satiety. He denies NSAID use or EtOH use. His daughter says he had endoscopy and colonoscopy by [facility] back in 2011 to evaluate esophageal thickening on CT (incidental finding on CTA to r/o PE). She says there were no abnormalities found and he was told he had GERD.   In the ED: T98.31F, RR 17, SpO2 99-100% on RA, HR 60s-80s, BP 130s-170s/50-80s. Hgb 5.4. FOBT positive. He was types and screened and given 2 units of PRBCs. He was feeling better upon my exam.  Hospital Course by problem list: Acute blood loss anemia due to GI bleed:  With positive FOBT and dark stools for 2 weeks there was concern for GI bleed.  His symptoms began to improve soon after receiving 2 units of PRBCs on hospital day one.  He continued to improve, remaining asymptomatic and reported ambulating in the hallways without problem.  An EGD performed on 8/9 and colonoscopy performed on 8/10 did not reveal a source for blood loss and only noted mild duodenitis and internal hemorrhoids.  Since work-up thus far did not reveal a source an outpatient capsule endoscopy was recommended to assess small bowel for potential bleeding source.  The patient was asymptomatic and hgb was stable at 8.0 on day of discharge.  He was discharged on Protonix  daily for acid suppression.  Aspirin was held at discharge but may be restarted if appropriate after work-up of blood loss completed and hgb improved.  [facility] will call patient to arrange capsule endoscopy as an outpatient.  The  patient will follow-up at his PCP office [facility]) on 08/23.  Essential hypertension:  Home regimen was continued.  Chronic kidney disease:  At baseline during admission.  Discharge Vitals:   BP 165/61 mmHg  Pulse 79  Temp(Src) 98.4 F (  36.9 C) (Oral)  Resp 16  Ht 5\' 2"  (1.575 m)  Wt 63.504 kg (140 lb)  BMI 25.60 kg/m2  SpO2 100%  Discharge Labs:  Results for orders placed or performed during the hospital encounter of 08/05/15 (from the past 24 hour(s))  CBC     Status: Abnormal   Collection Time: 08/09/15  3:00 PM  Result Value Ref Range   WBC 5.7 4.0 - 10.5 K/uL   RBC 3.09 (L) 4.22 - 5.81 MIL/uL   Hemoglobin 7.6 (L) 13.0 - 17.0 g/dL   HCT 16.1 (L) 09.6 - 04.5 %   MCV 79.0 78.0 - 100.0 fL   MCH 24.6 (L) 26.0 - 34.0 pg   MCHC 31.1 30.0 - 36.0 g/dL   RDW 40.9 (H) 81.1 - 91.4 %   Platelets 143 (L) 150 - 400 K/uL  CBC     Status: Abnormal   Collection Time: 08/10/15  6:22 AM  Result Value Ref Range   WBC 5.7 4.0 - 10.5 K/uL   RBC 2.87 (L) 4.22 - 5.81 MIL/uL   Hemoglobin 7.2 (L) 13.0 - 17.0 g/dL   HCT 78.2 (L) 95.6 - 21.3 %   MCV 79.8 78.0 - 100.0 fL   MCH 25.1 (L) 26.0 - 34.0 pg   MCHC 31.4 30.0 - 36.0 g/dL   RDW 08.6 (H) 57.8 - 46.9 %   Platelets 120 (L) 150 - 400 K/uL  CBC     Status: Abnormal   Collection Time: 08/10/15 12:41 PM  Result Value Ref Range   WBC 6.3 4.0 - 10.5 K/uL   RBC 3.20 (L) 4.22 - 5.81 MIL/uL   Hemoglobin 8.0 (L) 13.0 - 17.0 g/dL   HCT 62.9 (L) 52.8 - 41.3 %   MCV 79.7 78.0 - 100.0 fL   MCH 25.0 (L) 26.0 - 34.0 pg   MCHC 31.4 30.0 - 36.0 g/dL   RDW 24.4 (H) 01.0 - 27.2 %   Platelets 144 (L) 150 - 400 K/uL    Signed: Evelena Peat, DO  08/10/2015, 1:59 PM    Services Ordered on Discharge: none needed Equipment Ordered on Discharge: none needed

## 2015-08-10 NOTE — Discharge Instructions (Signed)
1. [facility] will call you to arrange the capsule endoscopy.  Please call them if you do not hear from them in 1 week. Please follow-up at [facility] on August 21, 2015 at 12PM.  Please arrive at 11:45AM.   2. Please take all medications as prescribed.    3. If you have worsening of your symptoms or new symptoms arise, please call the clinic ([phone]), or go to the ER immediately if symptoms are severe.

## 2015-08-10 NOTE — Progress Notes (Signed)
Pt is being discharged home. Discharge instructions were given to patient and family 

## 2015-08-15 ENCOUNTER — Other Ambulatory Visit: Payer: Self-pay

## 2015-08-15 NOTE — Patient Outreach (Signed)
Left HIPAA compliant voicemail; calling to confirm if patient would like Stroke [facility] Transition series.

## 2015-08-17 ENCOUNTER — Other Ambulatory Visit: Payer: Self-pay

## 2015-08-17 NOTE — Patient Outreach (Signed)
Left HIPAA compliant voicemail with patient.  Outreaching to obtain consent for [facility] Stroke Transition program.

## 2015-08-18 ENCOUNTER — Encounter (HOSPITAL_COMMUNITY): Payer: Self-pay | Admitting: Emergency Medicine

## 2015-08-18 ENCOUNTER — Inpatient Hospital Stay (HOSPITAL_COMMUNITY)
Admission: EM | Admit: 2015-08-18 | Discharge: 2015-08-22 | DRG: 378 | Disposition: A | Discharge status: Home / Self Care | Payer: Medicaid Other | Attending: Internal Medicine | Admitting: Internal Medicine

## 2015-08-18 DIAGNOSIS — R0600 Dyspnea, unspecified: Secondary | ICD-10-CM

## 2015-08-18 DIAGNOSIS — K219 Gastro-esophageal reflux disease without esophagitis: Secondary | ICD-10-CM | POA: Diagnosis present

## 2015-08-18 DIAGNOSIS — I5030 Unspecified diastolic (congestive) heart failure: Secondary | ICD-10-CM | POA: Diagnosis present

## 2015-08-18 DIAGNOSIS — I10 Essential (primary) hypertension: Secondary | ICD-10-CM | POA: Diagnosis present

## 2015-08-18 DIAGNOSIS — D649 Anemia, unspecified: Secondary | ICD-10-CM | POA: Diagnosis present

## 2015-08-18 DIAGNOSIS — N400 Enlarged prostate without lower urinary tract symptoms: Secondary | ICD-10-CM | POA: Diagnosis present

## 2015-08-18 DIAGNOSIS — Z79899 Other long term (current) drug therapy: Secondary | ICD-10-CM

## 2015-08-18 DIAGNOSIS — D62 Acute posthemorrhagic anemia: Secondary | ICD-10-CM | POA: Diagnosis present

## 2015-08-18 DIAGNOSIS — Z8249 Family history of ischemic heart disease and other diseases of the circulatory system: Secondary | ICD-10-CM

## 2015-08-18 DIAGNOSIS — R0602 Shortness of breath: Secondary | ICD-10-CM

## 2015-08-18 DIAGNOSIS — I129 Hypertensive chronic kidney disease with stage 1 through stage 4 chronic kidney disease, or unspecified chronic kidney disease: Secondary | ICD-10-CM | POA: Diagnosis present

## 2015-08-18 DIAGNOSIS — N189 Chronic kidney disease, unspecified: Secondary | ICD-10-CM | POA: Diagnosis present

## 2015-08-18 DIAGNOSIS — K922 Gastrointestinal hemorrhage, unspecified: Secondary | ICD-10-CM

## 2015-08-18 DIAGNOSIS — K921 Melena: Principal | ICD-10-CM | POA: Diagnosis present

## 2015-08-18 DIAGNOSIS — M10071 Idiopathic gout, right ankle and foot: Secondary | ICD-10-CM | POA: Diagnosis present

## 2015-08-18 LAB — COMPREHENSIVE METABOLIC PANEL
ALBUMIN: 3.2 g/dL — AB (ref 3.5–5.0)
ALK PHOS: 103 U/L (ref 38–126)
ALT: 22 U/L (ref 17–63)
AST: 20 U/L (ref 15–41)
Anion gap: 10 (ref 5–15)
BILIRUBIN TOTAL: 0.4 mg/dL (ref 0.3–1.2)
BUN: 20 mg/dL (ref 6–20)
CALCIUM: 8.9 mg/dL (ref 8.9–10.3)
CO2: 21 mmol/L — ABNORMAL LOW (ref 22–32)
CREATININE: 1.56 mg/dL — AB (ref 0.61–1.24)
Chloride: 111 mmol/L (ref 101–111)
GFR calc Af Amer: 48 mL/min — ABNORMAL LOW (ref >60)
GFR, EST NON AFRICAN AMERICAN: 41 mL/min — AB (ref >60)
GLUCOSE: 105 mg/dL — AB (ref 65–99)
Potassium: 4.3 mmol/L (ref 3.5–5.1)
Sodium: 142 mmol/L (ref 135–145)
TOTAL PROTEIN: 6.2 g/dL — AB (ref 6.5–8.1)

## 2015-08-18 LAB — CBC
HCT: 21.6 % — ABNORMAL LOW (ref 39.0–52.0)
Hemoglobin: 6.5 g/dL — CL (ref 13.0–17.0)
MCH: 24.3 pg — ABNORMAL LOW (ref 26.0–34.0)
MCHC: 30.1 g/dL (ref 30.0–36.0)
MCV: 80.9 fL (ref 78.0–100.0)
PLATELETS: 237 10*3/uL (ref 150–400)
RBC: 2.67 MIL/uL — ABNORMAL LOW (ref 4.22–5.81)
RDW: 19 % — AB (ref 11.5–15.5)
WBC: 5.9 10*3/uL (ref 4.0–10.5)

## 2015-08-18 LAB — URINALYSIS, ROUTINE W REFLEX MICROSCOPIC
BILIRUBIN URINE: NEGATIVE
Glucose, UA: NEGATIVE mg/dL
HGB URINE DIPSTICK: NEGATIVE
KETONES UR: NEGATIVE mg/dL
Leukocytes, UA: NEGATIVE
NITRITE: NEGATIVE
Protein, ur: NEGATIVE mg/dL
SPECIFIC GRAVITY, URINE: 1.019 (ref 1.005–1.030)
UROBILINOGEN UA: 0.2 mg/dL (ref 0.0–1.0)
pH: 5 (ref 5.0–8.0)

## 2015-08-18 LAB — LIPASE, BLOOD: Lipase: 49 U/L (ref 22–51)

## 2015-08-18 LAB — POC OCCULT BLOOD, ED: Fecal Occult Bld: POSITIVE — AB

## 2015-08-18 MED ORDER — SODIUM CHLORIDE 0.9 % IV SOLN
10.0000 mL/h | Freq: Once | INTRAVENOUS | Status: AC
  Administered 2015-08-19: 10 mL/h via INTRAVENOUS

## 2015-08-18 NOTE — ED Provider Notes (Signed)
CSN: [id number]     Arrival date & time 08/18/15  1951 History   First MD Initiated Contact with Patient 08/18/15 2134     Chief Complaint  Patient presents with  . Dizziness  . Shortness of Breath     (Consider location/radiation/quality/duration/timing/severity/associated sxs/prior Treatment) HPI Patient presents to the emergency department with shortness of breath and weakness started 3 days ago.  The patient states he was recently admitted to the hospital and a week after discharge started having the symptoms.  He states the shortness of breath is worse with exertion.  He denies any chest pain, abdominal pain, nausea, vomiting, dizziness, headache, blurred vision, back pain, fever, lightheadedness, anorexia, near syncope or syncope.  Patient states nothing seems to make his condition, better or worse other than exertion Past Medical History  Diagnosis Date  . Hypertension   . Gouty arthritis of toe of right foot   . GERD (gastroesophageal reflux disease)   . Shortness of breath dyspnea   . BPH (benign prostatic hyperplasia)    Past Surgical History  Procedure Laterality Date  . Prostatectomy    . Esophagogastroduodenoscopy (egd) with propofol N/A 08/07/2015    Procedure: ESOPHAGOGASTRODUODENOSCOPY (EGD) WITH PROPOFOL;  Surgeon: Charlott Rakes, MD;  Location: [facility] ENDOSCOPY;  Service: Endoscopy;  Laterality: N/A;  . Colonoscopy N/A 08/08/2015    Procedure: COLONOSCOPY;  Surgeon: Graylin Shiver, MD;  Location: [facility] ENDOSCOPY;  Service: Endoscopy;  Laterality: N/A;   Family History  Problem Relation Age of Onset  . Hypertension Father   . Hypertension Sister   . Hypertension Brother    Social History  Substance Use Topics  . Smoking status: Never Smoker   . Smokeless tobacco: None  . Alcohol Use: No    Review of Systems   All other systems negative except as documented in the HPI. All pertinent positives and negatives as reviewed in the HPI. Allergies  Review of patient's  allergies indicates no known allergies.  Home Medications   Prior to Admission medications   Medication Sig Start Date End Date Taking? Authorizing Provider  albuterol (PROVENTIL HFA;VENTOLIN HFA) 108 (90 BASE) MCG/ACT inhaler Inhale 2 puffs into the lungs every 6 (six) hours as needed for wheezing or shortness of breath. 07/30/15  Yes Doris Cheadle, MD  allopurinol (ZYLOPRIM) 100 MG tablet Take 1 tablet (100 mg total) by mouth daily. 06/20/15  Yes Doris Cheadle, MD  atorvastatin (LIPITOR) 40 MG tablet Take 1 tablet (40 mg total) by mouth daily at 6 PM. Patient taking differently: Take 40 mg by mouth daily.  07/20/15  Yes Doris Cheadle, MD  carvedilol (COREG) 25 MG tablet Take 1 tablet (25 mg total) by mouth 2 (two) times daily with a meal. 07/20/15  Yes Deepak Advani, MD  hydrALAZINE (APRESOLINE) 25 MG tablet Take 1 tablet (25 mg total) by mouth 3 (three) times daily. 07/20/15  Yes Deepak Advani, MD  pantoprazole (PROTONIX) 40 MG tablet Take 1 tablet (40 mg total) by mouth daily. 08/10/15  Yes Yolanda Manges, DO  colchicine 0.6 MG tablet Take 1 tablet (0.6 mg total) by mouth 2 (two) times daily. Patient not taking: Reported on 08/05/2015 07/20/15   Doris Cheadle, MD  isosorbide mononitrate (IMDUR) 30 MG 24 hr tablet Take 1 tablet (30 mg total) by mouth daily. Patient not taking: Reported on 08/05/2015 07/20/15   Doris Cheadle, MD   BP 171/53 mmHg  Pulse 78  Temp(Src) 98.7 F (37.1 C) (Oral)  Resp 17  Ht  (  1.575 m)  Wt 146 lb (66.225 kg)  BMI 26.70 kg/m2  SpO2 100% Physical Exam  Constitutional: He is oriented to person, place, and time. He appears well-developed and well-nourished. No distress.  HENT:  Head: Normocephalic.  Mouth/Throat: Oropharynx is clear and moist.  Eyes: Pupils are equal, round, and reactive to light.  Neck: Normal range of motion. Neck supple.  Cardiovascular: Normal rate, regular rhythm and normal heart sounds.  Exam reveals no gallop and no friction rub.   No murmur  heard. Pulmonary/Chest: Effort normal and breath sounds normal. No respiratory distress.  Neurological: He is alert and oriented to person, place, and time. He exhibits normal muscle tone. Coordination normal.  Skin: Skin is warm and dry. No rash noted. No erythema.  Psychiatric: He has a normal mood and affect. His behavior is normal.  Nursing note and vitals reviewed.   ED Course  Procedures (including critical care time) Labs Review Labs Reviewed  COMPREHENSIVE METABOLIC PANEL - Abnormal; Notable for the following:    CO2 21 (*)    Glucose, Bld 105 (*)    Creatinine, Ser 1.56 (*)    Total Protein 6.2 (*)    Albumin 3.2 (*)    GFR calc non Af Amer 41 (*)    GFR calc Af Amer 48 (*)    All other components within normal limits  CBC - Abnormal; Notable for the following:    RBC 2.67 (*)    Hemoglobin 6.5 (*)    HCT 21.6 (*)    MCH 24.3 (*)    RDW 19.0 (*)    All other components within normal limits  POC OCCULT BLOOD, ED - Abnormal; Notable for the following:    Fecal Occult Bld POSITIVE (*)    All other components within normal limits  LIPASE, BLOOD  URINALYSIS, ROUTINE W REFLEX MICROSCOPIC (NOT AT [facility])  TYPE AND SCREEN    Imaging Review No results found. I have personally reviewed and evaluated these images and lab results as part of my medical decision-making.   EKG Interpretation   Date/Time:  Saturday August 18 2015 20:04:33 EDT Ventricular Rate:  78 PR Interval:  146 QRS Duration: 78 QT Interval:  370 QTC Calculation: 421 R Axis:   65 Text Interpretation:  Normal sinus rhythm Biatrial enlargement Left  ventricular hypertrophy with repolarization abnormality Abnormal ECG ED  PHYSICIAN INTERPRETATION AVAILABLE IN [facility] Confirmed by TEST,  Record ([id number]) on 08/19/2015 8:31:34 AM      MDM   Final diagnoses:  None    Patient be admitted for anemia and his shortness of breath.  The patient is given blood transfusion hospitalists will be down to  evaluate the patient for admission  Charlestine Night, PA-C 08/20/15 0129  Arby Barrette, MD 09/05/15 [phone]

## 2015-08-18 NOTE — ED Notes (Signed)
Patient here with complaint of dizziness and shortness of breath. States recent history of admission for anemia. Was discharged about 2 weeks ago. Reports the same symptoms at that time. Recurrence began about 3 days ago.

## 2015-08-19 DIAGNOSIS — D62 Acute posthemorrhagic anemia: Secondary | ICD-10-CM | POA: Diagnosis present

## 2015-08-19 DIAGNOSIS — I129 Hypertensive chronic kidney disease with stage 1 through stage 4 chronic kidney disease, or unspecified chronic kidney disease: Secondary | ICD-10-CM | POA: Diagnosis present

## 2015-08-19 DIAGNOSIS — M10071 Idiopathic gout, right ankle and foot: Secondary | ICD-10-CM | POA: Diagnosis present

## 2015-08-19 DIAGNOSIS — N189 Chronic kidney disease, unspecified: Secondary | ICD-10-CM | POA: Diagnosis present

## 2015-08-19 DIAGNOSIS — K921 Melena: Secondary | ICD-10-CM | POA: Diagnosis present

## 2015-08-19 DIAGNOSIS — Z8249 Family history of ischemic heart disease and other diseases of the circulatory system: Secondary | ICD-10-CM | POA: Diagnosis not present

## 2015-08-19 DIAGNOSIS — K922 Gastrointestinal hemorrhage, unspecified: Secondary | ICD-10-CM | POA: Diagnosis present

## 2015-08-19 DIAGNOSIS — I5030 Unspecified diastolic (congestive) heart failure: Secondary | ICD-10-CM | POA: Diagnosis present

## 2015-08-19 DIAGNOSIS — N400 Enlarged prostate without lower urinary tract symptoms: Secondary | ICD-10-CM | POA: Diagnosis present

## 2015-08-19 DIAGNOSIS — I5032 Chronic diastolic (congestive) heart failure: Secondary | ICD-10-CM

## 2015-08-19 DIAGNOSIS — K219 Gastro-esophageal reflux disease without esophagitis: Secondary | ICD-10-CM | POA: Diagnosis present

## 2015-08-19 DIAGNOSIS — I10 Essential (primary) hypertension: Secondary | ICD-10-CM

## 2015-08-19 DIAGNOSIS — Z79899 Other long term (current) drug therapy: Secondary | ICD-10-CM | POA: Diagnosis not present

## 2015-08-19 DIAGNOSIS — D649 Anemia, unspecified: Secondary | ICD-10-CM | POA: Diagnosis present

## 2015-08-19 LAB — BASIC METABOLIC PANEL
ANION GAP: 8 (ref 5–15)
BUN: 20 mg/dL (ref 6–20)
CHLORIDE: 108 mmol/L (ref 101–111)
CO2: 23 mmol/L (ref 22–32)
Calcium: 8.6 mg/dL — ABNORMAL LOW (ref 8.9–10.3)
Creatinine, Ser: 1.5 mg/dL — ABNORMAL HIGH (ref 0.61–1.24)
GFR calc Af Amer: 50 mL/min — ABNORMAL LOW (ref >60)
GFR, EST NON AFRICAN AMERICAN: 43 mL/min — AB (ref >60)
Glucose, Bld: 106 mg/dL — ABNORMAL HIGH (ref 65–99)
POTASSIUM: 4.1 mmol/L (ref 3.5–5.1)
SODIUM: 139 mmol/L (ref 135–145)

## 2015-08-19 LAB — CBC
HCT: 25.7 % — ABNORMAL LOW (ref 39.0–52.0)
HEMATOCRIT: 18.9 % — AB (ref 39.0–52.0)
HEMOGLOBIN: 6 g/dL — AB (ref 13.0–17.0)
Hemoglobin: 8.2 g/dL — ABNORMAL LOW (ref 13.0–17.0)
MCH: 25.4 pg — ABNORMAL LOW (ref 26.0–34.0)
MCH: 25.5 pg — ABNORMAL LOW (ref 26.0–34.0)
MCHC: 31.7 g/dL (ref 30.0–36.0)
MCHC: 31.9 g/dL (ref 30.0–36.0)
MCV: 80.1 fL (ref 78.0–100.0)
MCV: 80.1 fL (ref 78.0–100.0)
PLATELETS: 208 10*3/uL (ref 150–400)
PLATELETS: 223 10*3/uL (ref 150–400)
RBC: 2.36 MIL/uL — AB (ref 4.22–5.81)
RBC: 3.21 MIL/uL — AB (ref 4.22–5.81)
RDW: 18.7 % — ABNORMAL HIGH (ref 11.5–15.5)
RDW: 17.5 % — AB (ref 11.5–15.5)
WBC: 5.6 10*3/uL (ref 4.0–10.5)
WBC: 7.2 10*3/uL (ref 4.0–10.5)

## 2015-08-19 LAB — HEMOGLOBIN AND HEMATOCRIT, BLOOD
HEMATOCRIT: 26.1 % — AB (ref 39.0–52.0)
Hemoglobin: 8.4 g/dL — ABNORMAL LOW (ref 13.0–17.0)

## 2015-08-19 LAB — PREPARE RBC (CROSSMATCH)

## 2015-08-19 MED ORDER — SODIUM CHLORIDE 0.9 % IV SOLN
80.0000 mg | Freq: Once | INTRAVENOUS | Status: AC
  Administered 2015-08-19: 80 mg via INTRAVENOUS
  Filled 2015-08-19: qty 80

## 2015-08-19 MED ORDER — ONDANSETRON HCL 4 MG PO TABS: 4.0000 mg | ORAL_TABLET | Freq: Four times a day (QID) | ORAL | Status: DC | PRN

## 2015-08-19 MED ORDER — ONDANSETRON HCL 4 MG/2ML IJ SOLN: 4.0000 mg | Freq: Four times a day (QID) | INTRAMUSCULAR | Status: DC | PRN

## 2015-08-19 MED ORDER — ACETAMINOPHEN 650 MG RE SUPP: 650.0000 mg | Freq: Four times a day (QID) | RECTAL | Status: DC | PRN

## 2015-08-19 MED ORDER — SODIUM CHLORIDE 0.9 % IV SOLN
8.0000 mg/h | INTRAVENOUS | Status: AC
  Administered 2015-08-19 – 2015-08-21 (×6): 8 mg/h via INTRAVENOUS
  Filled 2015-08-19 (×13): qty 80

## 2015-08-19 MED ORDER — SODIUM CHLORIDE 0.9 % IV SOLN
Freq: Once | INTRAVENOUS | Status: AC
  Administered 2015-08-19: 02:00:00 via INTRAVENOUS

## 2015-08-19 MED ORDER — OXYCODONE HCL 5 MG PO TABS: 5.0000 mg | ORAL_TABLET | ORAL | Status: DC | PRN

## 2015-08-19 MED ORDER — ACETAMINOPHEN 325 MG PO TABS: 650.0000 mg | ORAL_TABLET | Freq: Four times a day (QID) | ORAL | Status: DC | PRN

## 2015-08-19 MED ORDER — HYDRALAZINE HCL 20 MG/ML IJ SOLN
10.0000 mg | Freq: Four times a day (QID) | INTRAMUSCULAR | Status: DC | PRN
  Administered 2015-08-19: 10 mg via INTRAVENOUS
  Filled 2015-08-19: qty 1

## 2015-08-19 MED ORDER — CARVEDILOL 25 MG PO TABS
25.0000 mg | ORAL_TABLET | Freq: Two times a day (BID) | ORAL | Status: DC
  Administered 2015-08-19 – 2015-08-22 (×6): 25 mg via ORAL
  Filled 2015-08-19 (×7): qty 1

## 2015-08-19 MED ORDER — PANTOPRAZOLE SODIUM 40 MG IV SOLR
40.0000 mg | Freq: Two times a day (BID) | INTRAVENOUS | Status: DC
  Administered 2015-08-22: 40 mg via INTRAVENOUS
  Filled 2015-08-19: qty 40

## 2015-08-19 MED ORDER — HYDROMORPHONE HCL 1 MG/ML IJ SOLN: 0.5000 mg | INTRAMUSCULAR | Status: DC | PRN

## 2015-08-19 MED ORDER — SODIUM CHLORIDE 0.9 % IV SOLN
INTRAVENOUS | Status: DC
  Administered 2015-08-19: 03:00:00 via INTRAVENOUS

## 2015-08-19 MED ORDER — SODIUM CHLORIDE 0.9 % IV SOLN
INTRAVENOUS | Status: DC
  Administered 2015-08-22 (×2): via INTRAVENOUS

## 2015-08-19 MED ORDER — FUROSEMIDE 10 MG/ML IJ SOLN
20.0000 mg | Freq: Once | INTRAMUSCULAR | Status: AC
  Administered 2015-08-19: 20 mg via INTRAVENOUS
  Filled 2015-08-19: qty 2

## 2015-08-19 MED ORDER — LEVALBUTEROL HCL 0.63 MG/3ML IN NEBU
0.6300 mg | INHALATION_SOLUTION | Freq: Once | RESPIRATORY_TRACT | Status: AC
  Administered 2015-08-19: 0.63 mg via RESPIRATORY_TRACT
  Filled 2015-08-19: qty 3

## 2015-08-19 MED ORDER — PANTOPRAZOLE SODIUM 40 MG IV SOLR: 40.0000 mg | INTRAVENOUS | Status: DC

## 2015-08-19 NOTE — Progress Notes (Signed)
   Subjective:  [name] was seen and examined this morning with family at bedside. He provided some additional history, reporting that he had been feeling weak with shortness of breath since discharge. He said he he was not yet feeling better from the ongoing blood transfusion. Otherwise, he had no new complaints.  Objective: Vital signs in last 24 hours: Filed Vitals:   08/19/15 0555 08/19/15 0847 08/19/15 0911 08/19/15 1143  BP: 167/73 159/72 161/61 163/60  Pulse: 75 73 75 74  Temp: 98.4 F (36.9 C) 98.5 F (36.9 C) 98.9 F (37.2 C) 98.3 F (36.8 C)  TempSrc: Oral Oral Oral Oral  Resp: Height:      Weight:      SpO2: 100% 100% 100% 100%   Weight change:   Intake/Output Summary (Last 24 hours) at 08/19/15 1312 Last data filed at 08/19/15 1143  Gross per 24 hour  Intake 1673.33 ml  Output    125 ml  Net 1548.33 ml   Physical Exam General: resting in bed receiving transfusion Cardiac: 2/6 systolic murmur, RRR, no murmurs.  Pulm: clear to auscultation bilaterally, moving normal volumes of air Abd: BS present. Soft, NT/ND Ext: warm and well perfused, no pedal edema  Basic Metabolic Panel:  Recent Labs Lab 08/18/15 2045 08/19/15 0213  NA 142 139  K 4.3 4.1  CL 111 108  CO2 21* 23  GLUCOSE 105* 106*  BUN 20 20  CREATININE 1.56* 1.50*  CALCIUM 8.9 8.6*   Liver Function Tests:  Recent Labs Lab 08/18/15 2045  AST 20  ALT 22  ALKPHOS 103  BILITOT 0.4  PROT 6.2*  ALBUMIN 3.2*    Recent Labs Lab 08/18/15 2045  LIPASE 49   CBC:  Recent Labs Lab 08/18/15 2045 08/19/15 0213  WBC 5.9 5.6  HGB 6.5* 6.0*  HCT 21.6* 18.9*  MCV 80.9 80.1  PLT 237 208    Medications: I have reviewed the patient's current medications. Scheduled Meds: . carvedilol  25 mg Oral BID WC  . [START ON 08/22/2015] pantoprazole (PROTONIX) IV  40 mg Intravenous Q12H   Continuous Infusions: . sodium chloride 50 mL/hr at 08/19/15 0245  . pantoprozole (PROTONIX)  infusion 8 mg/hr (08/19/15 1226)   PRN Meds:.acetaminophen **OR** acetaminophen, hydrALAZINE, HYDROmorphone (DILAUDID) injection, ondansetron **OR** ondansetron (ZOFRAN) IV, oxyCODONE Assessment/Plan:  Anemia due blood loss:  Likely due to previously undetected GI bleed. Previous endoscopy and colonoscopy could not find a bleeding source. Patient has received 2 units pRBCs today. He continues to endorse weakness and shortness of breath. - Physicist, medical GI for inpatient capsule study to evaluate bleeding from the small intestine - Transfuse for Hgb <7 - Protonix 8 mg/hr for 72 hours - Hgb/Hct q8h for 6 occurences   Essential HTN:  Continue coreg. Hydralazine prn for SBP >170  CKD:  Cr. Stable at 1.5  DVT Prophylaxis: SCDs  Dispo: Disposition is deferred at this time, awaiting improvement of current medical problems.  Anticipated discharge in approximately 2-3 day(s).   The patient does have a current PCP (Doris Cheadle, MD) and does not need an [facility] hospital follow-up appointment after discharge.  The patient does not have transportation limitations that hinder transportation to clinic appointments.  .Services Needed at time of discharge: Y = Yes, Blank = No PT:   OT:   RN:   Equipment:   Other:     LOS: 0 days   Ruben Im, MD 08/19/2015, 1:12 PM

## 2015-08-19 NOTE — ED Notes (Signed)
Attempted report x 1.  RN to call this RN when ready. 

## 2015-08-19 NOTE — Consult Note (Signed)
Subjective:   HPI  The patient is a 79 year old [name] who we are asked to see in regards to recurrent anemia and melena. The patient was recently in the hospital for the same thing and on August 9 had any EGD which showed minimal duodenitis. On August 10 he had a colonoscopy which looked normal except for internal hemorrhoids. He was sent home with a hemoglobin of 8 but comes back in now with a hemoglobin of 6. He states that he has continued to have black stools. He was supposed to be set up for an outpatient capsule endoscopy. That had not been done yet.  Review of Systems No chest pain or shortness of breath. Patient feels somewhat weak.  Past Medical History  Diagnosis Date  . Hypertension   . Gouty arthritis of toe of right foot   . GERD (gastroesophageal reflux disease)   . Shortness of breath dyspnea   . BPH (benign prostatic hyperplasia)    Past Surgical History  Procedure Laterality Date  . Prostatectomy    . Esophagogastroduodenoscopy (egd) with propofol N/A 08/07/2015    Procedure: ESOPHAGOGASTRODUODENOSCOPY (EGD) WITH PROPOFOL;  Surgeon: Charlott Rakes, MD;  Location: [facility] ENDOSCOPY;  Service: Endoscopy;  Laterality: N/A;  . Colonoscopy N/A 08/08/2015    Procedure: COLONOSCOPY;  Surgeon: Graylin Shiver, MD;  Location: [facility] ENDOSCOPY;  Service: Endoscopy;  Laterality: N/A;   Social History   Social History  . Marital Status: Married    Spouse Name: N/A  . Number of Children: N/A  . Years of Education: N/A   Occupational History  . Not on file.   Social History Main Topics  . Smoking status: Never Smoker   . Smokeless tobacco: Not on file  . Alcohol Use: No  . Drug Use: No  . Sexual Activity: Not on file   Other Topics Concern  . Not on file   Social History Narrative   family history includes Hypertension in his brother, father, and sister.  Current facility-administered medications:  .  0.9 %  sodium chloride infusion, , Intravenous, Continuous, Ron Parker, MD, Last Rate: 50 mL/hr at 08/19/15 0245 .  acetaminophen (TYLENOL) tablet 650 mg, 650 mg, Oral, Q6H PRN **OR** acetaminophen (TYLENOL) suppository 650 mg, 650 mg, Rectal, Q6H PRN, Ron Parker, MD .  carvedilol (COREG) tablet 25 mg, 25 mg, Oral, BID WC, Ron Parker, MD, 25 mg at 08/19/15 0835 .  hydrALAZINE (APRESOLINE) injection 10 mg, 10 mg, Intravenous, Q6H PRN, Ron Parker, MD .  HYDROmorphone (DILAUDID) injection 0.5-1 mg, 0.5-1 mg, Intravenous, Q3H PRN, Ron Parker, MD .  ondansetron (ZOFRAN) tablet 4 mg, 4 mg, Oral, Q6H PRN **OR** ondansetron (ZOFRAN) injection 4 mg, 4 mg, Intravenous, Q6H PRN, Ron Parker, MD .  oxyCODONE (Oxy IR/ROXICODONE) immediate release tablet 5 mg, 5 mg, Oral, Q4H PRN, Ron Parker, MD .  pantoprazole (PROTONIX) 80 mg in sodium chloride 0.9 % 250 mL (0.32 mg/mL) infusion, 8 mg/hr, Intravenous, Continuous, Ron Parker, MD, Last Rate: 25 mL/hr at 08/19/15 1226, 8 mg/hr at 08/19/15 1226 .  [START ON 08/22/2015] pantoprazole (PROTONIX) injection 40 mg, 40 mg, Intravenous, Q12H, Harvette Velora Heckler, MD No Known Allergies   Objective:     BP 170/72 mmHg  Pulse 75  Temp(Src) 97.8 F (36.6 C) (Oral)  Resp 18  Ht 5\' 2"  (1.575 m)  Wt 66.225 kg (146 lb)  BMI [name].70 kg/m2  SpO2 100%  No distress, nonicteric or graft heart regular  rhythm no murmurs  Lungs clear  Abdomen: Bowel sounds normal, soft, nontender  Laboratory No components found for: D1    Assessment:     Gastrointestinal bleeding characterized by melena. Recent EGD and colonoscopy were unrevealing for a source of this. The plan was for outpatient capsule endoscopy.      Plan:     Since he is back in the hospital I will set up a capsule endoscopy in patient. Transfuse blood as needed. Lab Results  Component Value Date   HGB 8.2* 08/19/2015   HGB 6.0* 08/19/2015   HGB 6.5* 08/18/2015   HCT 25.7* 08/19/2015   HCT 18.9* 08/19/2015   HCT  21.6* 08/18/2015   ALKPHOS 103 08/18/2015   ALKPHOS 115 08/06/2015   ALKPHOS 91 05/29/2015   AST 20 08/18/2015   AST 16 08/06/2015   AST 13* 05/29/2015   ALT 22 08/18/2015   ALT 24 08/06/2015   ALT 14* 05/29/2015

## 2015-08-19 NOTE — Progress Notes (Signed)
Pt c/o SOB with activity but breathing normal when resting in bed. O2 saturations 99-100%. No other complaints at this time. O2 placed at 2L via nasal canula. Per family translation pt feeling better with O2. Will continue to monitor.

## 2015-08-19 NOTE — H&P (Signed)
[facility] Admission History and Physical       Broadus Costilla AVW:[id number] DOB: 01/20/1936 DOA: 08/18/2015  Referring physician: EDP PCP: Doris Cheadle, MD  Specialists: [facility]  Chief Complaint: SOB and Weakness  HPI: [name] is a 79 y.o. male with a history of HTN, GERD, CKD, and Gout who presents to the ED with complaints of SOB weakness and lightheadedness x 3 days and passing dark stools for the past 2 weeks.   He denies any chest pain or syncope, and denies any fevers or chills.   He was evaluated in the ED and was found to have a hemoglobin level of 6.5 and an FOBT was HEME positive.   He was admitted for the same 2 weeks ago and was transfused 2 units of blood  And had an Upper endoscopy on 08/09 by GI Dr Bosie Clos, and a Colonoscopy on 08/10 by Dr. Evette Cristal.   He was to follow up as an outpatient for a Capsule study to evaluate his small bowel.      Review of Systems:  Constitutional: No Weight Loss, No Weight Gain, Night Sweats, Fevers, Chills, +Dizziness, +Light Headedness, +Fatigue, +Generalized Weakness HEENT: No Headaches, Difficulty Swallowing,Tooth/Dental Problems,Sore Throat,  No Sneezing, Rhinitis, Ear Ache, Nasal Congestion, or Post Nasal Drip,  Cardio-vascular:  No Chest pain, Orthopnea, PND, Edema in Lower Extremities, Anasarca, Dizziness, Palpitations  Resp:    +Dyspnea, No DOE, No Productive Cough, No Non-Productive Cough, No Hemoptysis, No Wheezing.    GI: No Heartburn, Indigestion, Abdominal Pain, Nausea, Vomiting, Diarrhea, Constipation, Hematemesis, Hematochezia, +Melena, Change in Bowel Habits,  Loss of Appetite  GU: No Dysuria, No Change in Color of Urine, No Urgency or Urinary Frequency, No Flank pain.  Musculoskeletal: No Joint Pain or Swelling, No Decreased Range of Motion, No Back Pain.  Neurologic: No Syncope, No Seizures, Muscle Weakness, Paresthesia, Vision Disturbance or Loss, No Diplopia, No Vertigo, No Difficulty Walking,  Skin: No Rash or  Lesions. Psych: No Change in Mood or Affect, No Depression or Anxiety, No Memory loss, No Confusion, or Hallucinations   Past Medical History  Diagnosis Date  . Hypertension   . Gouty arthritis of toe of right foot   . GERD (gastroesophageal reflux disease)   . Shortness of breath dyspnea   . BPH (benign prostatic hyperplasia)        CKD  Past Surgical History  Procedure Laterality Date  . Prostatectomy    . Esophagogastroduodenoscopy (egd) with propofol N/A 08/07/2015    Procedure: ESOPHAGOGASTRODUODENOSCOPY (EGD) WITH PROPOFOL;  Surgeon: Charlott Rakes, MD;  Location: [facility] ENDOSCOPY;  Service: Endoscopy;  Laterality: N/A;  . Colonoscopy N/A 08/08/2015    Procedure: COLONOSCOPY;  Surgeon: Graylin Shiver, MD;  Location: [facility] ENDOSCOPY;  Service: Endoscopy;  Laterality: N/A;      Prior to Admission medications   Medication Sig Start Date End Date Taking? Authorizing Provider  albuterol (PROVENTIL HFA;VENTOLIN HFA) 108 (90 BASE) MCG/ACT inhaler Inhale 2 puffs into the lungs every 6 (six) hours as needed for wheezing or shortness of breath. 07/30/15  Yes Doris Cheadle, MD  allopurinol (ZYLOPRIM) 100 MG tablet Take 1 tablet (100 mg total) by mouth daily. 06/20/15  Yes Doris Cheadle, MD  atorvastatin (LIPITOR) 40 MG tablet Take 1 tablet (40 mg total) by mouth daily at 6 PM. Patient taking differently: Take 40 mg by mouth daily.  07/20/15  Yes Doris Cheadle, MD  carvedilol (COREG) 25 MG tablet Take 1 tablet (25 mg total) by mouth 2 (two)  times daily with a meal. 07/20/15  Yes Deepak Advani, MD  hydrALAZINE (APRESOLINE) 25 MG tablet Take 1 tablet (25 mg total) by mouth 3 (three) times daily. 07/20/15  Yes Deepak Advani, MD  pantoprazole (PROTONIX) 40 MG tablet Take 1 tablet (40 mg total) by mouth daily. 08/10/15  Yes Yolanda Manges, DO  colchicine 0.6 MG tablet Take 1 tablet (0.6 mg total) by mouth 2 (two) times daily. Patient not taking: Reported on 08/05/2015 07/20/15   Doris Cheadle, MD  isosorbide  mononitrate (IMDUR) 30 MG 24 hr tablet Take 1 tablet (30 mg total) by mouth daily. Patient not taking: Reported on 08/05/2015 07/20/15   Doris Cheadle, MD     No Known Allergies  Social History:  reports that he has never smoked. He does not have any smokeless tobacco history on file. He reports that he does not drink alcohol or use illicit drugs.    Family History  Problem Relation Age of Onset  . Hypertension Father   . Hypertension Sister   . Hypertension Brother        Physical Exam:  GEN:  Pleasant Well Nourished and Well Developed Elderly 79 y.o.African male examined and in no acute distress; cooperative with exam Filed Vitals:   08/18/15 2330 08/18/15 2345 08/19/15 0015 08/19/15 0054  BP: 136/62 141/66 129/79 165/66  Pulse: 74 73 75 78  Temp:    98.3 F (36.8 C)  TempSrc:    Oral  Resp: 22 17 20 18   Height:      Weight:      SpO2: 100% 100% 100% 100%   Blood pressure 165/66, pulse 78, temperature 98.3 F (36.8 C), temperature source Oral, resp. rate 18, height 5\' 2"  (1.575 m), weight 66.225 kg (146 lb), SpO2 100 %. PSYCH: He is alert and oriented x4; does not appear anxious does not appear depressed; affect is normal HEENT: Normocephalic and Atraumatic, Mucous membranes pink; PERRLA; EOM intact; Fundi:  Benign;  No scleral icterus, Nares: Patent, Oropharynx: Clear, Edentulous   Neck:  FROM, No Cervical Lymphadenopathy nor Thyromegaly or Carotid Bruit; No JVD; Breasts:: Not examined CHEST WALL: No tenderness CHEST: Normal respiration, clear to auscultation bilaterally HEART: Regular rate and rhythm; no murmurs rubs or gallops BACK: No kyphosis or scoliosis; No CVA tenderness ABDOMEN: Positive Bowel Sounds, Soft Non-Tender, No Rebound or Guarding; No Masses, No Organomegaly. Rectal Exam: Not done EXTREMITIES: No Cyanosis, Clubbing, or Edema; No Ulcerations. Genitalia: not examined PULSES: 2+ and symmetric SKIN: Normal hydration no rash or ulceration CNS:  Alert and  Oriented x 4, No Focal Deficits Vascular: pulses palpable throughout    Labs on Admission:  Basic Metabolic Panel:  Recent Labs Lab 08/18/15 2045  NA 142  K 4.3  CL 111  CO2 21*  GLUCOSE 105*  BUN 20  CREATININE 1.56*  CALCIUM 8.9   Liver Function Tests:  Recent Labs Lab 08/18/15 2045  AST 20  ALT 22  ALKPHOS 103  BILITOT 0.4  PROT 6.2*  ALBUMIN 3.2*    Recent Labs Lab 08/18/15 2045  LIPASE 49   No results for input(s): AMMONIA in the last 168 hours. CBC:  Recent Labs Lab 08/18/15 2045  WBC 5.9  HGB 6.5*  HCT 21.6*  MCV 80.9  PLT 237   Cardiac Enzymes: No results for input(s): CKTOTAL, CKMB, CKMBINDEX, TROPONINI in the last 168 hours.  BNP (last 3 results)  Recent Labs  05/28/15 1813 08/05/15 1525  BNP 415.5* 853.8*    ProBNP (  last 3 results) No results for input(s): PROBNP in the last 8760 hours.  CBG: No results for input(s): GLUCAP in the last 168 hours.  Radiological Exams on Admission: No results found.   EKG: Independently reviewed. Normal sinu Rhythm with PACs, and LVH Changes rate = 78   Assessment/Plan:   79 y.o. male with  Principal Problem:   1.    Anemia/Acute blood loss anemia   Telemetry Monitoring   Transfuse 2 units PRBCs with Lasix between   IV Protonix   Check H/Hs every 8 hrs x 6   Consult [facility] in AM      Active Problems:   2.    GI bleed   Had Upper Endoscopy and Colonoscopy 2 weeks ago by [facility]     And was to Follow up as Outpt in Sept for Capsule Small Bowel Follow Through     3.    Essential hypertension   Continue Carvedilol, and Hydralazine as BP tolerates   Monitor BPs        4.    CKD (chronic kidney disease)   Monitor BUN/Cr     5.    Diastolic CHF   Monitor to avoid Fluid Overload     6.    DVT Prophylaxis   SCDs          Code Status:     FULL CODE        Family Communication:   Family at Bedside       Disposition Plan:    Inpatient  Status        Time spent: 69  Minutes      Ron Parker [facility] Pager [phone]   If 7AM -7PM Please Contact the Day Rounding Team MD for [facility]  If 7PM-7AM, Please Contact Night-Floor Coverage  [email or web address] Password [email or web address] 08/19/2015, 1:04 AM     ADDENDUM:   Patient was seen and examined on 08/19/2015

## 2015-08-19 NOTE — Progress Notes (Signed)
RN notified pt experiencing SOB again. O2 saturation at 99-100%. MD on-call paged. New orders received. Will continue to monitor.

## 2015-08-20 ENCOUNTER — Encounter (HOSPITAL_COMMUNITY): Payer: Self-pay | Admitting: *Deleted

## 2015-08-20 ENCOUNTER — Inpatient Hospital Stay (HOSPITAL_COMMUNITY): Payer: Medicaid Other

## 2015-08-20 ENCOUNTER — Encounter (HOSPITAL_COMMUNITY)
Admission: EM | Disposition: A | Discharge status: Home / Self Care | Payer: Self-pay | Source: Home / Self Care | Attending: Internal Medicine

## 2015-08-20 DIAGNOSIS — R0602 Shortness of breath: Secondary | ICD-10-CM

## 2015-08-20 DIAGNOSIS — N189 Chronic kidney disease, unspecified: Secondary | ICD-10-CM

## 2015-08-20 DIAGNOSIS — I129 Hypertensive chronic kidney disease with stage 1 through stage 4 chronic kidney disease, or unspecified chronic kidney disease: Secondary | ICD-10-CM

## 2015-08-20 DIAGNOSIS — D62 Acute posthemorrhagic anemia: Secondary | ICD-10-CM

## 2015-08-20 HISTORY — PX: GIVENS CAPSULE STUDY: SHX5432

## 2015-08-20 LAB — TYPE AND SCREEN
ABO/RH(D): O POS
Antibody Screen: NEGATIVE
Unit division: 0
Unit division: 0

## 2015-08-20 SURGERY — IMAGING PROCEDURE, GI TRACT, INTRALUMINAL, VIA CAPSULE
Anesthesia: LOCAL

## 2015-08-20 MED ORDER — LEVALBUTEROL HCL 0.63 MG/3ML IN NEBU
0.6300 mg | INHALATION_SOLUTION | Freq: Once | RESPIRATORY_TRACT | Status: AC
  Administered 2015-08-20: 0.63 mg via RESPIRATORY_TRACT
  Filled 2015-08-20: qty 3

## 2015-08-20 MED ORDER — TECHNETIUM TC 99M DIETHYLENETRIAME-PENTAACETIC ACID: 42.5000 | Freq: Once | INTRAVENOUS | Status: DC | PRN

## 2015-08-20 MED ORDER — LORAZEPAM 2 MG/ML IJ SOLN
0.5000 mg | Freq: Once | INTRAMUSCULAR | Status: AC
  Administered 2015-08-20: 0.5 mg via INTRAVENOUS
  Filled 2015-08-20: qty 1

## 2015-08-20 MED ORDER — FUROSEMIDE 10 MG/ML IJ SOLN: 40.0000 mg | Freq: Once | INTRAMUSCULAR | Status: DC

## 2015-08-20 MED ORDER — TECHNETIUM TO 99M ALBUMIN AGGREGATED
6.0000 | Freq: Once | INTRAVENOUS | Status: AC | PRN
  Administered 2015-08-20: 6 via INTRAVENOUS

## 2015-08-20 SURGICAL SUPPLY — 1 items: TOWEL COTTON PACK 4EA (MISCELLANEOUS) ×4 IMPLANT

## 2015-08-20 NOTE — Progress Notes (Signed)
Subjective: [name] was evaluated this morning.  He was sitting up in bed resting.  Son and daughter were available for translating.  Patient states he feels better today compared to yesterday.  He had an episode of SOB and wheezing last night but has since felt better and has not had another episode.  Capsule endoscopy was started at 8am this morning and patient is currently NPO.  Patient has not been able to stand or walk without feeling SOB.  Patient reported one dark bowel movement this morning.  Patient reports mild abdominal discomfort.  Patient denies dizziness, anxiety, chest pain, or peripheral edema.  Objective: Vital signs in last 24 hours: Filed Vitals:   08/19/15 2346 08/20/15 0132 08/20/15 0416 08/20/15 0636  BP:   166/71 160/76  Pulse:   87 89  Temp:    98.3 F (36.8 C)  TempSrc:    Oral  Resp:   18 20  Height:      Weight:      SpO2: 100% 100% 100% 100%   Weight change:   Intake/Output Summary (Last 24 hours) at 08/20/15 1003 Last data filed at 08/19/15 1954  Gross per 24 hour  Intake 313.33 ml  Output    900 ml  Net -586.67 ml   General: resting in bed HEENT: PERRL, EOMI, scleral icterus Cardiac: RRR, Aortic Stenosis (not new) no rubs, or gallops Pulm: clear to auscultation bilaterally, moving normal volumes of air Abd: soft, nontender, nondistended, BS present Ext: warm and well perfused, no pedal edema Neuro: alert and oriented X3  Lab Results: Lab Results  Component Value Date   WBC 7.2 08/19/2015   HGB 8.4* 08/19/2015   HCT 26.1* 08/19/2015   MCV 80.1 08/19/2015   PLT 223 08/19/2015   BMET    Component Value Date/Time   NA 139 08/19/2015 0213   K 4.1 08/19/2015 0213   CL 108 08/19/2015 0213   CO2 23 08/19/2015 0213   GLUCOSE 106* 08/19/2015 0213   BUN 20 08/19/2015 0213   CREATININE 1.50* 08/19/2015 0213   CREATININE 1.59* 06/07/2015 1440   CALCIUM 8.6* 08/19/2015 0213   GFRNONAA 43* 08/19/2015 0213   GFRAA 50* 08/19/2015 0213    Micro  Results: No results found for this or any previous visit (from the past 240 hour(s)). Studies/Results: Dg Chest Port 1 View  08/20/2015   CLINICAL DATA:  Shortness of breath  EXAM: PORTABLE CHEST - 1 VIEW  COMPARISON:  08/05/2015  FINDINGS: Borderline heart size with normal pulmonary vascularity. No focal airspace disease or consolidation in the lungs. No blunting of costophrenic angles. No pneumothorax. Mediastinal contours appear intact. Old fracture deformity of the right clavicle.  IMPRESSION: No active disease.   Electronically Signed   By: Burman Nieves M.D.   On: 08/20/2015 01:30   Medications: I have reviewed the patient's current medications. Scheduled Meds: . carvedilol  25 mg Oral BID WC  . [START ON 08/22/2015] pantoprazole (PROTONIX) IV  40 mg Intravenous Q12H   Continuous Infusions: . sodium chloride    . pantoprozole (PROTONIX) infusion 8 mg/hr (08/19/15 2235)   PRN Meds:.acetaminophen **OR** acetaminophen, hydrALAZINE, HYDROmorphone (DILAUDID) injection, ondansetron **OR** ondansetron (ZOFRAN) IV, oxyCODONE Assessment/Plan:  Acute blood loss anemia:  Patient was in the hospital on 8/7 for SOB.  Hgb was found to be 5.4 and positive FOBT.  Patient had colonoscopy and endoscopy that showed no source of acute bleed.   Patient was to follow up outpatient for capsule endoscopy but procedure  did not occur.  On current admission 8/20 patient is presenting with similar symptoms (SOB) with a Hgb of 6 and positive FOBT.  Patient was given 2 units of blood and current Hgb is 8.4.  GI was consulted and patient is currently having capsule endoscopy.     - Transfuse for Hgb <7 - IV protonix - morning CBC  Essential hypertension:  Patient has a PMHx of Aortic stenosis.  Patient is on Coreg  BID and Hydralazine  TID.  Patient was started on Hydralazine from a previous hospital admission 5/30 and continued Hydralazine on discharge. During current admission 8/20 Hydralazine was stopped  and is currently PRN SBP >170.  Patient had an abrupt episode of SOB and wheezing 8/21 at night.  Son and daughter report this has never happened before.  Patient was given Xopenex and Ativan 0.5mg  once.  Chest X-ray showed no pulmonary edema.  Patient's symptoms have not occurred since the breathing treatment and Ativan.  Not sure if SOB episode was due to stopping Hydralazine as patient has been taking it 3x daily for the past 3 months. - Consider titrating Hydralazine if another episode of SOB occurs and then stopping Hydralazine and using PRN.     - Consider adding an ACE-I like Benazepril to help control HTN - Follow up outpatient with Cardiology.  Patient had an appointment to discuss BP medication but with current hospital visit patient was not able to make it.    CKD (chronic kidney disease):  Creatinine 1.5.  Patient's baseline is (1.5 -1.6) -Stable at baseline, continue to monitor     This is a Psychologist, occupational Note.  The care of the patient was discussed with Dr. Rogelia Boga and the assessment and plan formulated with their assistance.  Please see their attached note for official documentation of the daily encounter.   LOS: 1 day   Camelia Phenes, Med Student 08/20/2015, 10:03 AM

## 2015-08-20 NOTE — Progress Notes (Signed)
NURSING PROGRESS NOTE  [name] [id number] Admission Data: 08/20/2015 8:51 AM Attending Provider: Tyson Alias, MD [id number], Ayesha Rumpf, MD Code Status: Full   [name] is a 79 y.o. male patient admitted from ED:  -No acute distress noted.  -No complaints of shortness of breath.  -No complaints of chest pain.   Cardiac Monitoring: Box # 4 in place. Cardiac monitor yields:normal sinus rhythm.  Blood pressure 165/66, pulse 78, temperature 98.3 F (36.8 C), temperature source Oral, resp. rate 18, height  (1.575 m), weight 66.225 kg (146 lb), SpO2 100 %.  IV Fluids:  IV in place, occlusive dsg intact without redness, IV cath antecubital left, condition patent and no redness normal saline.   Allergies:  Review of patient's allergies indicates no known allergies.  Past Medical History:   has a past medical history of Hypertension; Gouty arthritis of toe of right foot; GERD (gastroesophageal reflux disease); Shortness of breath dyspnea; and BPH (benign prostatic hyperplasia).  Past Surgical History:   has past surgical history that includes Prostatectomy; Esophagogastroduodenoscopy (egd) with propofol (N/A, 08/07/2015); and Colonoscopy (N/A, 08/08/2015).  Social History:   reports that he has never smoked. He does not have any smokeless tobacco history on file. He reports that he does not drink alcohol or use illicit drugs.  Skin: intact  Patient/Family orientated to room. Information packet given to patient/family. Admission inpatient armband information verified with patient/family to include name and date of birth and placed on patient arm. Side rails up x 2, fall assessment and education completed with patient/family. Patient/family able to verbalize understanding of risk associated with falls and verbalized understanding to call for assistance before getting out of bed. Call light within reach. Patient/family able to voice and demonstrate understanding of unit orientation  instructions.

## 2015-08-20 NOTE — Care Management Note (Signed)
Case Management Note  Patient Details  Name: Anik Wesch MRN: [id number] Date of Birth: 11-03-1936  Subjective/Objective:                 Spoke with patient's son and daughter at bedside. Patient and wife live with son. Son denies any difficulties obtaining or paying for medication. Patient has medicaid, eft message with [employer], and HAR note placed.    Action/Plan:  Will continue to follow and offer resources as needed.  Expected Discharge Date:                  Expected Discharge Plan:  Home/Self Care  In-House Referral:     Discharge planning Services  CM Consult  Post Acute Care Choice:    Choice offered to:     DME Arranged:    DME Agency:     HH Arranged:    HH Agency:     Status of Service:  In process, will continue to follow  Medicare Important Message Given:    Date Medicare IM Given:    Medicare IM give by:    Date Additional Medicare IM Given:    Additional Medicare Important Message give by:     If discussed at Long Length of Stay Meetings, dates discussed:    Additional Comments:  Lawerance Sabal, RN 08/20/2015, 11:51 AM

## 2015-08-20 NOTE — Progress Notes (Signed)
Utilization Review completed. Keshia Weare RN BSN CM 

## 2015-08-20 NOTE — Progress Notes (Addendum)
INTERNAL MEDICINE TEACHING SERVICE Night Float Progress Note   Subjective:    We were called overnight by the RN for evaluation of patients shortness of breath. At time of evaluation, pt was lying in bed in no acute distress with family at the bedside.  He is on 2L via nasal cannula.  States he is not having chest pain and is cooperative with the exam.     Objective:    BP 192/72 mmHg  Pulse 88  Temp(Src) 98.5 F (36.9 C) (Oral)  Resp 30  Ht  (1.575 m)  Wt 146 lb (66.225 kg)  BMI 26.70 kg/m2  SpO2 100%   Labs: Basic Metabolic Panel:    Component Value Date/Time   NA 139 08/19/2015 0213   K 4.1 08/19/2015 0213   CL 108 08/19/2015 0213   CO2 23 08/19/2015 0213   BUN 20 08/19/2015 0213   CREATININE 1.50* 08/19/2015 0213   CREATININE 1.59* 06/07/2015 1440   GLUCOSE 106* 08/19/2015 0213   CALCIUM 8.6* 08/19/2015 0213    CBC:    Component Value Date/Time   WBC 7.2 08/19/2015 1310   HGB 8.4* 08/19/2015 1828   HCT 26.1* 08/19/2015 1828   PLT 223 08/19/2015 1310   MCV 80.1 08/19/2015 1310   NEUTROABS 2.7 11/19/2009 2140   LYMPHSABS 2.4 11/19/2009 2140   MONOABS 0.6 11/19/2009 2140   EOSABS 0.0 11/19/2009 2140   BASOSABS 0.0 11/19/2009 2140    Cardiac Enzymes: Lab Results  Component Value Date   TROPONINI <0.03 08/06/2015    Physical Exam: General: Well-developed, well-nourished, in no acute distress; alert, appropriate and cooperative throughout examination.  Lungs:  Normal respiratory effort. Clear to auscultation BL without crackles or wheezes.  Does sound like he has a mild anterior wheeze  Heart: RRR. 2/6 systolic murmur.  Abdomen:  BS normoactive. Soft, Nondistended, non-tender.  No masses or organomegaly.  Extremities: No pretibial edema.     Assessment/ Plan:    79 yo male with shortness of breath saturating at 99-100% on 2L via nasal cannula.  Has been receiving NS IV fluids at 33ml/hr.  Patient denies chest pain and appears well on exam.  Suspect  anxiousness may be contributing to patients SOB. -will get chest xray  -Xopenex breathing treatments -Ativan 0.5mg  once  ADDENDUM 01:34 AM - chest xray reviewed.  Does not appear to have pulmonary edema present to warrant IV lasix at this time.  Continue plan for Xopenex breathing treatments and Ativan   Gwynn Burly, DO  08/20/2015, 12:47 AM

## 2015-08-20 NOTE — Progress Notes (Signed)
Subjective: Complaints of SOB, presenting complaints. Also reason for other hospital admission, usually relieved by discharge, after transfusion. But family states that SOB this time persistent and worsening since admission. He was able to ambulate abit at home after last discharge. He has been wearing his SCDs here. He never smoked cigarretes, he denies leg swelling, discolouration  or pain, denies cough.  Objective: Vital signs in last 24 hours: Filed Vitals:   08/19/15 2346 08/20/15 0132 08/20/15 0416 08/20/15 0636  BP:   166/71 160/76  Pulse:   87 89  Temp:    98.3 F (36.8 C)  TempSrc:    Oral  Resp:   18 20  Height:      Weight:      SpO2: 100% 100% 100% 100%   Weight change:   Intake/Output Summary (Last 24 hours) at 08/20/15 1215 Last data filed at 08/20/15 1127  Gross per 24 hour  Intake     60 ml  Output    900 ml  Net   -840 ml   General appearance: alert, cooperative and no distress Head: Normocephalic, without obvious abnormality, atraumatic Lungs: clear to auscultation bilaterally Heart: 3/6 systolic murmur- aortic area, regular rate and rhythm, Abdomen: soft, non-tender; bowel sounds normal; no masses,  no organomegaly Extremities: extremities normal, atraumatic, no cyanosis or edema  Lab Results: Basic Metabolic Panel:  Recent Labs Lab 08/18/15 2045 08/19/15 0213  NA 142 139  K 4.3 4.1  CL 111 108  CO2 21* 23  GLUCOSE 105* 106*  BUN 20 20  CREATININE 1.56* 1.50*  CALCIUM 8.9 8.6*   Liver Function Tests:  Recent Labs Lab 08/18/15 2045  AST 20  ALT 22  ALKPHOS 103  BILITOT 0.4  PROT 6.2*  ALBUMIN 3.2*    Recent Labs Lab 08/18/15 2045  LIPASE 49   CBC:  Recent Labs Lab 08/19/15 0213 08/19/15 1310 08/19/15 1828  WBC 5.6 7.2  --   HGB 6.0* 8.2* 8.4*  HCT 18.9* 25.7* 26.1*  MCV 80.1 80.1  --   PLT 208 223  --    Urinalysis:  Recent Labs Lab 08/18/15 2130  COLORURINE YELLOW  LABSPEC 1.019  PHURINE 5.0  GLUCOSEU  NEGATIVE  HGBUR NEGATIVE  BILIRUBINUR NEGATIVE  KETONESUR NEGATIVE  PROTEINUR NEGATIVE  UROBILINOGEN 0.2  NITRITE NEGATIVE  LEUKOCYTESUR NEGATIVE   Dg Chest Port 1 View  08/20/2015   CLINICAL DATA:  Shortness of breath  EXAM: PORTABLE CHEST - 1 VIEW  COMPARISON:  08/05/2015  FINDINGS: Borderline heart size with normal pulmonary vascularity. No focal airspace disease or consolidation in the lungs. No blunting of costophrenic angles. No pneumothorax. Mediastinal contours appear intact. Old fracture deformity of the right clavicle.  IMPRESSION: No active disease.   Electronically Signed   By: Burman Nieves M.D.   On: 08/20/2015 01:30   Medications: I have reviewed the patient's current medications. Scheduled Meds: . carvedilol  25 mg Oral BID WC  . [START ON 08/22/2015] pantoprazole (PROTONIX) IV  40 mg Intravenous Q12H   Continuous Infusions: . sodium chloride    . pantoprozole (PROTONIX) infusion 8 mg/hr (08/19/15 2235)   PRN Meds:.acetaminophen **OR** acetaminophen, hydrALAZINE, HYDROmorphone (DILAUDID) injection, ondansetron **OR** ondansetron (ZOFRAN) IV, oxyCODONE Assessment/Plan: Principal Problem:   Melena Active Problems:   Essential hypertension   CKD (chronic kidney disease)   GI bleed   Acute blood loss anemia   Normocytic anemia   Hypertension   Diastolic CHF  Symptomatic Acute Blood loss Anemia- Hgb  dropped to 6. S/p 2 units of blood. hgb today- 8.4. - Cbc Am - Presently on PPI infusion - GI consulted, recs appreciated.  - Planned for capsule endoscopy today - Goal Hgb- >8 - IV iron prior to discharge, and home with PO iron  SOB- Family says SOB has gotten worse since transfusion, usually this resolves. Concern for PE considering, he has not been on anticoag due to GI bleed. - Will order VQ scan, he has no obvious prior Cardiopulmonary disease.  HTN- Bp elevated- 160/76. Elevated. Home meds- Coreg and hydralazine.  - Cont Home coreg- 25mg  daily. - Consider  starting Norvasc for Bp control, for now will hold given pt is acutely bleeding, also consider ACE-inh will need follow up in 2 weeks to check Cr.  - Hydralazine might not be a good option for this patient now, considering he has other options that would improve compliance.  CKD- Likely due to chronic uncontrolled HTN. Cr- Stable at 1.5.   DVT ppx- SCDs.   Dispo: Disposition is deferred at this time, awaiting improvement of current medical problems.    The patient does not know have a current PCP (Doris Cheadle, MD) and does not know need an [facility] hospital follow-up appointment after discharge.  The patient does not know have transportation limitations that hinder transportation to clinic appointments.  .Services Needed at time of discharge: Y = Yes, Blank = No PT:   OT:   RN:   Equipment:   Other:     LOS: 1 day   Onnie Boer, MD 08/20/2015, 12:15 PM

## 2015-08-21 ENCOUNTER — Inpatient Hospital Stay: Payer: No Typology Code available for payment source | Admitting: Family Medicine

## 2015-08-21 ENCOUNTER — Encounter (HOSPITAL_COMMUNITY): Payer: Self-pay | Admitting: Gastroenterology

## 2015-08-21 LAB — CBC
HCT: 26.8 % — ABNORMAL LOW (ref 39.0–52.0)
Hemoglobin: 8.4 g/dL — ABNORMAL LOW (ref 13.0–17.0)
MCH: 25.1 pg — ABNORMAL LOW (ref 26.0–34.0)
MCHC: 31.3 g/dL (ref 30.0–36.0)
MCV: 80 fL (ref 78.0–100.0)
PLATELETS: 275 10*3/uL (ref 150–400)
RBC: 3.35 MIL/uL — AB (ref 4.22–5.81)
RDW: 18.3 % — ABNORMAL HIGH (ref 11.5–15.5)
WBC: 6.3 10*3/uL (ref 4.0–10.5)

## 2015-08-21 MED ORDER — LOSARTAN POTASSIUM 50 MG PO TABS
25.0000 mg | ORAL_TABLET | Freq: Every day | ORAL | Status: DC
  Administered 2015-08-21 – 2015-08-22 (×2): 25 mg via ORAL
  Filled 2015-08-21 (×3): qty 1

## 2015-08-21 MED ORDER — HYDRALAZINE HCL 20 MG/ML IJ SOLN
10.0000 mg | Freq: Four times a day (QID) | INTRAMUSCULAR | Status: DC | PRN
  Administered 2015-08-21: 10 mg via INTRAVENOUS
  Filled 2015-08-21 (×2): qty 1

## 2015-08-21 NOTE — Progress Notes (Deleted)
Will repeat capsule endoscopy and follow-up with results later today.

## 2015-08-21 NOTE — Progress Notes (Signed)
Patient with BP=190/85; MD notified. Will continue to monitor.

## 2015-08-21 NOTE — Progress Notes (Signed)
GI addendum: capsule study completely nml. Cont diet, monitor stools and Hb

## 2015-08-21 NOTE — Discharge Summary (Signed)
Name: [name] MRN: [id number] DOB: 1936-04-29 79 y.o. PCP: Doris Cheadle, MD  Date of Admission: 08/18/2015  9:20 PM Date of Discharge: 08/21/2015 Attending Physician: Burns Spain, MD  Discharge Diagnosis: Acute blood loss anemia due to GI bleed Essential hypertension Gout  Discharge Medications:   Medication List    STOP taking these medications        allopurinol 100 MG tablet  Commonly known as:  ZYLOPRIM     hydrALAZINE 25 MG tablet  Commonly known as:  APRESOLINE     isosorbide mononitrate 30 MG 24 hr tablet  Commonly known as:  IMDUR      TAKE these medications        albuterol 108 (90 BASE) MCG/ACT inhaler  Commonly known as:  PROVENTIL HFA;VENTOLIN HFA  Inhale 2 puffs into the lungs every 6 (six) hours as needed for wheezing or shortness of breath.     atorvastatin 40 MG tablet  Commonly known as:  LIPITOR  Take 1 tablet (40 mg total) by mouth daily at 6 PM.     carvedilol 25 MG tablet  Commonly known as:  COREG  Take 1 tablet (25 mg total) by mouth 2 (two) times daily with a meal.     colchicine 0.6 MG tablet  Take 1 tablet (0.6 mg total) by mouth daily. Take until flare subsides.     losartan 25 MG tablet  Commonly known as:  COZAAR  Take 1 tablet (25 mg total) by mouth daily.     pantoprazole 40 MG tablet  Commonly known as:  PROTONIX  Take 1 tablet (40 mg total) by mouth daily.        Disposition and follow-up:   Mr.Graycen Breeden was discharged from [facility] in Stable condition.  At the hospital follow up visit please address:  1.  Is he having melena/gross blood loss?  Any symptoms of anemia?  2.  Labs / imaging needed at time of follow-up: CBC  3.  Pending labs/ test needing follow-up: none  Follow-up Appointments: [facility] 08/31 for hospital follow-up appt   Consultations: [facility]  Procedures Performed:   Nm Pulmonary Perf And Vent  08/20/2015   CLINICAL DATA:  Shortness of  breath for a few days worsened today  EXAM: NUCLEAR MEDICINE VENTILATION - PERFUSION LUNG SCAN  TECHNIQUE: Ventilation images were obtained in multiple projections using inhaled aerosol Tc-51m DTPA. Perfusion images were obtained in multiple projections after intravenous injection of Tc-41m MAA.  RADIOPHARMACEUTICALS:  42.5 mCi Technetium-6m DTPA aerosol inhalation and 6.1 mCi Technetium-39m MAA IV  COMPARISON:  None  Correlation: Chest radiograph 08/20/2015  FINDINGS: Ventilation: Significant central airway deposition of aerosol. Poor delivery of aerosol to the periphery of both lungs. Swallowed aerosol within stomach. Question large subsegmental ventilation defect in the anterior segment of the RIGHT upper lobe.  Perfusion: No segmental or subsegmental perfusion defects. Minimal irregularity of perfusion at the periphery of the RIGHT lung.  Chest radiograph:  Borderline enlargement of cardiac silhouette.  IMPRESSION: Very low probability for pulmonary embolism.   Electronically Signed   By: Ulyses Southward M.D.   On: 08/20/2015 17:09   Dg Chest Port 1 View  08/20/2015   CLINICAL DATA:  Shortness of breath  EXAM: PORTABLE CHEST - 1 VIEW  COMPARISON:  08/05/2015  FINDINGS: Borderline heart size with normal pulmonary vascularity. No focal airspace disease or consolidation in the lungs. No blunting of costophrenic angles. No pneumothorax. Mediastinal contours appear  intact. Old fracture deformity of the right clavicle.  IMPRESSION: No active disease.   Electronically Signed   By: Burman Nieves M.D.   On: 08/20/2015 01:30   Admission HPI: [name] is a 79 y.o. male with a history of HTN, GERD, CKD, and Gout who presents to the ED with complaints of SOB weakness and lightheadedness x 3 days and passing dark stools for the past 2 weeks. He denies any chest pain or syncope, and denies any fevers or chills. He was evaluated in the ED and was found to have a hemoglobin level of 6.5 and an FOBT was HEME  positive. He was admitted for the same 2 weeks ago and was transfused 2 units of blood And had an Upper endoscopy on 08/09 by GI Dr Bosie Clos, and a Colonoscopy on 08/10 by Dr. Evette Cristal. He was to follow up as an outpatient for a Capsule study to evaluate his small bowel.   Hospital Course by problem list:  Acute blood loss anemia due to GI bleed:  Mr. Lumley was transfused 2 units PRBCs.  His dyspnea did not improve as quickly as on previous admissions and there was concern for PE, however V/Q scan was low probability.  GI was consulted and an inpatient capsule endoscopy was done which did not reveal a source of acute bleeding.  GI felt repeat studies would be low yield at this time and recommended monitoring hemoglobin/stools and obtaining studies during active blood loss.  His symptoms improved and hgb stabilized around 8.3.  Because Mr. Borboa is likely to bleed again we recommended frequent outpatient Hgb checks.  He will get repeat labs to check stability of hgb on 08/26 in [facility] (one time lab visit).  Additionally, he will have repeat labs on 08/31 at [facility]. He will have hospital follow-up at [facility] on 08/31.  PCP can then determine frequency of CBC checks.  The patient was instructed to contact [facility] if Hgb drops below 7.5.  He is to proceed to ER if hgb below 7 or any hgb with symptoms of chest pain, dyspnea or lightheadedness.  Ideally, if patient is admitted with blood loss again, he will need immediate endoscopy once stable.  Would recommend PCP refer patient to [facility] for follow-up.  Essential hypertension:  Mr. Docken was hypertensive during admission 140-190s/50-70s.  Home meds were Coreg 25mg  BID and Hydralazine 25mg  TID.  He has a hx of gout so hydralazine was stopped and Losartan was added for BP and uricosuric effect.  Imdur was stopped because the patient reported not taking it due to hypotension.  He missed his Cardiology appointment while being in the hospital.  This appointment was rescheduled  for 9/2.  During admission patient's BP appeared to be better controlled on Coreg and the addition of Losartan 25mg .  Losartan can be titrated up as needed.       Gout:  He complained of some toe pain on day of discharge and had small amount of swelling.  There was concern for gout flare as allopurinol was inadvertently held during admission.  The patient was treated with colchicine 1.2 followed by 0.6 on day of discharge.  Allopurinol was not re-initiated in order to avoid worsening or acute flare (he had been off of it for about four days).  He was provided with small amount of colchicine to take daily if symptoms persisted.  This can be refilled by his PCP if needed.  Would recommend holding allopurinol for about four weeks post  flare and then re-initiating this med.  Discharge Vitals:   BP 190/85 mmHg  Pulse 72  Temp(Src) 98.2 F (36.8 C) (Oral)  Resp 20  Ht  (1.575 m)  Wt 66.225 kg (146 lb)  BMI 26.70 kg/m2  SpO2 100%  Discharge Labs:  Results for orders placed or performed during the hospital encounter of 08/18/15 (from the past 24 hour(s))  CBC     Status: Abnormal   Collection Time: 08/21/15  7:25 AM  Result Value Ref Range   WBC 6.3 4.0 - 10.5 K/uL   RBC 3.35 (L) 4.22 - 5.81 MIL/uL   Hemoglobin 8.4 (L) 13.0 - 17.0 g/dL   HCT 16.1 (L) 09.6 - 04.5 %   MCV 80.0 78.0 - 100.0 fL   MCH 25.1 (L) 26.0 - 34.0 pg   MCHC 31.3 30.0 - 36.0 g/dL   RDW 40.9 (H) 81.1 - 91.4 %   Platelets 275 150 - 400 K/uL    Signed: Evelena Peat, DO  08/21/2015, 2:06 PM    Services Ordered on Discharge: none Equipment Ordered on Discharge: none

## 2015-08-21 NOTE — Progress Notes (Signed)
Subjective: [name] was seen and examined this AM.  He denies dyspnea or CP.  He was able to ambulated to bathroom this AM without symptoms.  He had a normal color stool.    Objective: Vital signs in last 24 hours: Filed Vitals:   08/20/15 1432 08/20/15 2147 08/21/15 0422 08/21/15 0533  BP: 151/57 156/67  162/72  Pulse: 82 79  71  Temp: 98.7 F (37.1 C) 98.7 F (37.1 C)  98.3 F (36.8 C)  TempSrc: Oral Oral  Oral  Resp: 16 18  18   Height:      Weight:      SpO2: 100% 100% 100% 100%   Weight change:   Intake/Output Summary (Last 24 hours) at 08/21/15 0743 Last data filed at 08/21/15 0620  Gross per 24 hour  Intake 283.33 ml  Output      0 ml  Net 283.33 ml   General: resting in bed in NAD HEENT: [place]/AT Cardiac: RRR, no rubs or gallops, +2/6 systolic murmur Pulm: clear to auscultation bilaterally, moving normal volumes of air Abd: soft, nontender, nondistended, BS present Ext: warm and well perfused, no pedal edema Neuro: alert and oriented X3, MAE spontaneously, responding appropriately  Lab Results: CBC:  Recent Labs Lab 08/19/15 1310 08/19/15 1828 08/21/15 0725  WBC 7.2  --  6.3  HGB 8.2* 8.4* 8.4*  HCT 25.7* 26.1* 26.8*  MCV 80.1  --  80.0  PLT 223  --  275   Medications: I have reviewed the patient's current medications. Scheduled Meds: . carvedilol  25 mg Oral BID WC  . [START ON 08/22/2015] pantoprazole (PROTONIX) IV  40 mg Intravenous Q12H   Continuous Infusions: . sodium chloride    . pantoprozole (PROTONIX) infusion 8 mg/hr (08/21/15 0332)   PRN Meds:.acetaminophen **OR** acetaminophen, hydrALAZINE, HYDROmorphone (DILAUDID) injection, ondansetron **OR** ondansetron (ZOFRAN) IV, oxyCODONE, technetium TC 63M diethylenetriame-pentaacetic acid   Assessment/Plan: 79 year old male with PMH of gout, HTN, CKD, dHF admitted earlier this month with GI bleed of unclear etiology and scheduled for outpatient capsule endoscopy.  He returns with dyspnea and  melena found to be FOBT + with Hgb 6.  Acute blood loss anemia 2/2 GI bleed:  Symptoms resolved s/p 2 units of PRBCs on 08/21.  Appreciate GI intervention and recommendations.  EGD and colonoscopy on last admission (2 weeks ago) unrevealing.  Had capsule study yesterday which is normal.  GI feels enteroscopy would be low yield at this point, suggest monitoring hgb and try and repeat imaging (inpatient or outpatient) when actively bleeding. - resume diet - continue PPI - monitor inpatient overnight for stability; will contact GI for intervention if hgb drops again or overt bleeding; can consider d/c home tomorrow if stable and asymptomatic; will need close follow-up and serial labs (every few days) with immediate eval for diagnostic procedure once hgb drops  - hgb check in AM unless he has significant bleeding today; transfuse for Hgb < 7  Essential hypertension:  Elevated on Coreg.  At home he is on Coreg and hydralazine.   - continue Coreg - add losartan (hx of gout)  Diet:  Regular  VTE ppx:  SCDs, no pharmacologic VTE ppx due to GI bleed. Code:  Full   Dispo: Disposition is deferred at this time, awaiting improvement of current medical problems.  Anticipated discharge in approximately 1-2 day(s).   The patient does have a current PCP (Doris Cheadle, MD) and does not need an [facility] hospital follow-up appointment after discharge.  The patient does not know have transportation limitations that hinder transportation to clinic appointments.  .Services Needed at time of discharge: Y = Yes, Blank = No PT:   OT:   RN:   Equipment:   Other:     LOS: 2 days   Yolanda Manges, DO 08/21/2015, 7:43 AM

## 2015-08-21 NOTE — Progress Notes (Signed)
Subjective: [name] was evaluated this morning on rounds.  Son was available for translating.  Patient reports no SOB and was able to walk around in the room this morning without difficulty.  Capsule endoscopy was concluded yesterday.  Patient had a bowel movement and described the stool to be less dark than previously.  Patient denies dizziness, chest pain, abdominal pain, or peripheral edema.  Objective: Vital signs in last 24 hours: Filed Vitals:   08/20/15 1432 08/20/15 2147 08/21/15 0422 08/21/15 0533  BP: 151/57 156/67  162/72  Pulse: 82 79  71  Temp: 98.7 F (37.1 C) 98.7 F (37.1 C)  98.3 F (36.8 C)  TempSrc: Oral Oral  Oral  Resp: Height:      Weight:      SpO2: 100% 100% 100% 100%   Weight change:   Intake/Output Summary (Last 24 hours) at 08/21/15 1047 Last data filed at 08/21/15 0900  Gross per 24 hour  Intake 343.33 ml  Output      0 ml  Net 343.33 ml   General: resting in bed HEENT: PERRL, EOMI, no scleral icterus Cardiac: Aortic Stenosis (not new) RRR, no rubs, or gallops Pulm: clear to auscultation bilaterally, moving normal volumes of air Abd: soft, nontender, nondistended, BS present Ext: warm and well perfused, no pedal edema Neuro: alert and oriented X3  Lab Results: Lab Results  Component Value Date   WBC 6.3 08/21/2015   HGB 8.4* 08/21/2015   HCT 26.8* 08/21/2015   MCV 80.0 08/21/2015   PLT 275 08/21/2015   BMP Latest Ref Rng 08/19/2015 08/18/2015 08/06/2015  Glucose 65 - 99 mg/dL 161(W) 960(A) 86  BUN 6 - 20 mg/dL 20 20 54(U)  Creatinine 0.61 - 1.24 mg/dL 9.81(X) 9.14(N) 8.29(F)  Sodium 135 - 145 mmol/L 139 142 140  Potassium 3.5 - 5.1 mmol/L 4.1 4.3 3.9  Chloride 101 - 111 mmol/L 108 111 109  CO2 22 - 32 mmol/L 23 21(L) 23  Calcium 8.9 - 10.3 mg/dL 6.2(Z) 8.9 3.0(Q)    Micro Results: No results found for this or any previous visit (from the past 240 hour(s)). Studies/Results: Nm Pulmonary Perf And Vent  08/20/2015    CLINICAL DATA:  Shortness of breath for a few days worsened today  EXAM: NUCLEAR MEDICINE VENTILATION - PERFUSION LUNG SCAN  TECHNIQUE: Ventilation images were obtained in multiple projections using inhaled aerosol Tc-59m DTPA. Perfusion images were obtained in multiple projections after intravenous injection of Tc-38m MAA.  RADIOPHARMACEUTICALS:  42.5 mCi Technetium-71m DTPA aerosol inhalation and 6.1 mCi Technetium-17m MAA IV  COMPARISON:  None  Correlation: Chest radiograph 08/20/2015  FINDINGS: Ventilation: Significant central airway deposition of aerosol. Poor delivery of aerosol to the periphery of both lungs. Swallowed aerosol within stomach. Question large subsegmental ventilation defect in the anterior segment of the RIGHT upper lobe.  Perfusion: No segmental or subsegmental perfusion defects. Minimal irregularity of perfusion at the periphery of the RIGHT lung.  Chest radiograph:  Borderline enlargement of cardiac silhouette.  IMPRESSION: Very low probability for pulmonary embolism.   Electronically Signed   By: Ulyses Southward M.D.   On: 08/20/2015 17:09   Dg Chest Port 1 View  08/20/2015   CLINICAL DATA:  Shortness of breath  EXAM: PORTABLE CHEST - 1 VIEW  COMPARISON:  08/05/2015  FINDINGS: Borderline heart size with normal pulmonary vascularity. No focal airspace disease or consolidation in the lungs. No blunting of costophrenic angles. No pneumothorax. Mediastinal contours appear intact. Old  fracture deformity of the right clavicle.  IMPRESSION: No active disease.   Electronically Signed   By: Burman Nieves M.D.   On: 08/20/2015 01:30   Medications: I have reviewed the patient's current medications. Scheduled Meds: . carvedilol  25 mg Oral BID WC  . losartan  25 mg Oral Daily  . [START ON 08/22/2015] pantoprazole (PROTONIX) IV  40 mg Intravenous Q12H   Continuous Infusions: . sodium chloride    . pantoprozole (PROTONIX) infusion 8 mg/hr (08/21/15 0332)   PRN Meds:.acetaminophen **OR**  acetaminophen, HYDROmorphone (DILAUDID) injection, ondansetron **OR** ondansetron (ZOFRAN) IV, oxyCODONE, technetium TC 35M diethylenetriame-pentaacetic acid  Assessment/Plan: Acute blood loss anemia:  Current Hgb is 8.4 (previous Hgb was 8.4). Capsule endoscopy was done yesterday and GI reports study is completely nml.  GI suggests to continue diet, monitor stools and Hb.  Yesterday patient continued to report SOB and V/Q scan was ordered showing very low probability for pulmonary embolism.  The continuation of SOB could still be from the acute blood loss. This morning patient showed clinical improvement stating he no longer has SOB.     - Monitor patient for the next 24hrs.  If there are signs of bleeding contact GI for repeat imaging - Restart diet - ambulate patient in hallways, if patient continues to do well and Hgb stable with no signs of acute bleed anticipate discharge  - Transfuse for Hgb <7 - IV protonix  Essential hypertension: Patient has a PMHx of Aortic stenosis. Patient is on Coreg 25mg  BID. Hydralazine was stopped and is currently PRN SBP >170.  Patient has a hx of gout and Losartan was chosen for BP control due to the added benefit of helping control uric acid.   - Adding Losartan 25mg  to help control HTN - Follow up outpatient with Cardiology. Patient had an appointment to discuss BP medication but with current hospital visit patient was not able to make it.   CKD (chronic kidney disease): Stable at baseline -continue to monitor  This is a Psychologist, occupational Note.  The care of the patient was discussed with Dr. Andrey Campanile and the assessment and plan formulated with their assistance.  Please see their attached note for official documentation of the daily encounter.   LOS: 2 days   Camelia Phenes, Med Student 08/21/2015, 10:47 AM

## 2015-08-21 NOTE — Progress Notes (Signed)
[facility] Progress Note  Subjective: Feels better, tolerating diet, one stool today described is less dark and previous  Objective: Vital signs in last 24 hours: Temp:  [98.3 F (36.8 C)-98.7 F (37.1 C)] 98.3 F (36.8 C) (08/23 0533) Pulse Rate:  [71-82] 71 (08/23 0533) Resp:  [16-18] 18 (08/23 0533) BP: (151-162)/(57-72) 162/72 mmHg (08/23 0533) SpO2:  [100 %] 100 % (08/23 0533) Weight change:    PE: Alert and oriented no acute distress. Abdomen soft  Lab Results: Results for orders placed or performed during the hospital encounter of 08/18/15 (from the past 24 hour(s))  CBC     Status: Abnormal   Collection Time: 08/21/15  7:25 AM  Result Value Ref Range   WBC 6.3 4.0 - 10.5 K/uL   RBC 3.35 (L) 4.22 - 5.81 MIL/uL   Hemoglobin 8.4 (L) 13.0 - 17.0 g/dL   HCT 16.1 (L) 09.6 - 04.5 %   MCV 80.0 78.0 - 100.0 fL   MCH 25.1 (L) 26.0 - 34.0 pg   MCHC 31.3 30.0 - 36.0 g/dL   RDW 40.9 (H) 81.1 - 91.4 %   Platelets 275 150 - 400 K/uL    Studies/Results: Nm Pulmonary Perf And Vent  08/20/2015   CLINICAL DATA:  Shortness of breath for a few days worsened today  EXAM: NUCLEAR MEDICINE VENTILATION - PERFUSION LUNG SCAN  TECHNIQUE: Ventilation images were obtained in multiple projections using inhaled aerosol Tc-11m DTPA. Perfusion images were obtained in multiple projections after intravenous injection of Tc-58m MAA.  RADIOPHARMACEUTICALS:  42.5 mCi Technetium-73m DTPA aerosol inhalation and 6.1 mCi Technetium-62m MAA IV  COMPARISON:  None  Correlation: Chest radiograph 08/20/2015  FINDINGS: Ventilation: Significant central airway deposition of aerosol. Poor delivery of aerosol to the periphery of both lungs. Swallowed aerosol within stomach. Question large subsegmental ventilation defect in the anterior segment of the RIGHT upper lobe.  Perfusion: No segmental or subsegmental perfusion defects. Minimal irregularity of perfusion at the periphery of the RIGHT lung.  Chest  radiograph:  Borderline enlargement of cardiac silhouette.  IMPRESSION: Very low probability for pulmonary embolism.   Electronically Signed   By: Ulyses Southward M.D.   On: 08/20/2015 17:09   Dg Chest Port 1 View  08/20/2015   CLINICAL DATA:  Shortness of breath  EXAM: PORTABLE CHEST - 1 VIEW  COMPARISON:  08/05/2015  FINDINGS: Borderline heart size with normal pulmonary vascularity. No focal airspace disease or consolidation in the lungs. No blunting of costophrenic angles. No pneumothorax. Mediastinal contours appear intact. Old fracture deformity of the right clavicle.  IMPRESSION: No active disease.   Electronically Signed   By: Burman Nieves M.D.   On: 08/20/2015 01:30      Assessment: GI bleeding of unclear origin recent EGD and colonoscopy negative.  Plan: Will repeat capsule endoscopy this morning and leave further recommendations.    [name] C 08/21/2015, 9:15 AM  Pager [phone] If no answer or after 5 PM call [phone]

## 2015-08-22 ENCOUNTER — Other Ambulatory Visit: Payer: Self-pay | Admitting: Internal Medicine

## 2015-08-22 ENCOUNTER — Encounter: Payer: Self-pay | Admitting: Internal Medicine

## 2015-08-22 DIAGNOSIS — D62 Acute posthemorrhagic anemia: Secondary | ICD-10-CM

## 2015-08-22 DIAGNOSIS — M109 Gout, unspecified: Secondary | ICD-10-CM

## 2015-08-22 LAB — CBC
HCT: 26.2 % — ABNORMAL LOW (ref 39.0–52.0)
HEMOGLOBIN: 8.3 g/dL — AB (ref 13.0–17.0)
MCH: 25.2 pg — ABNORMAL LOW (ref 26.0–34.0)
MCHC: 31.7 g/dL (ref 30.0–36.0)
MCV: 79.6 fL (ref 78.0–100.0)
Platelets: 255 10*3/uL (ref 150–400)
RBC: 3.29 MIL/uL — AB (ref 4.22–5.81)
RDW: 18.6 % — ABNORMAL HIGH (ref 11.5–15.5)
WBC: 6.4 10*3/uL (ref 4.0–10.5)

## 2015-08-22 MED ORDER — COLCHICINE 0.6 MG PO TABS: 1.2000 mg | ORAL_TABLET | Freq: Once | ORAL | Status: DC

## 2015-08-22 MED ORDER — COLCHICINE 0.6 MG PO TABS: 0.6000 mg | ORAL_TABLET | Freq: Every day | ORAL | Status: DC

## 2015-08-22 MED ORDER — LOSARTAN POTASSIUM 25 MG PO TABS: 25.0000 mg | ORAL_TABLET | Freq: Every day | ORAL | Status: DC

## 2015-08-22 MED ORDER — COLCHICINE 0.6 MG PO TABS
1.2000 mg | ORAL_TABLET | ORAL | Status: AC
  Administered 2015-08-22: 1.2 mg via ORAL
  Filled 2015-08-22: qty 2

## 2015-08-22 NOTE — Progress Notes (Signed)
Subjective: [name] was seen and examined this AM.  He denies CP, dyspnea, lightheadedness.  No further dark stools or gross blood loss noted.  Objective: Vital signs in last 24 hours: Filed Vitals:   08/21/15 1317 08/21/15 1437 08/21/15 2117 08/22/15 0610  BP: 190/85 148/57 151/67 149/69  Pulse:  80 80 73  Temp:   99.3 F (37.4 C) 98.9 F (37.2 C)  TempSrc:   Oral Oral  Resp:   20 20  Height:      Weight:      SpO2:  100% 100% 100%   Weight change:   Intake/Output Summary (Last 24 hours) at 08/22/15 0750 Last data filed at 08/22/15 0600  Gross per 24 hour  Intake 483.67 ml  Output      0 ml  Net 483.67 ml   General: sitting up in chair HEENT: [place]/AT Cardiac: RRR, no rubs or gallops, +2/6 systolic murmur Pulm: clear to auscultation bilaterally, moving normal volumes of air Abd: soft, nontender, nondistended, BS present Ext: warm and well perfused, no pedal edema Neuro: alert and oriented X3, MAE spontaneously, responding appropriately  Lab Results: CBC:  Recent Labs Lab 08/21/15 0725 08/22/15 0533  WBC 6.3 6.4  HGB 8.4* 8.3*  HCT 26.8* 26.2*  MCV 80.0 79.6  PLT 275 255   Medications: I have reviewed the patient's current medications. Scheduled Meds: . carvedilol  25 mg Oral BID WC  . losartan  25 mg Oral Daily  . pantoprazole (PROTONIX) IV  40 mg Intravenous Q12H   Continuous Infusions: . sodium chloride 20 mL/hr at 08/22/15 0249   PRN Meds:.acetaminophen **OR** acetaminophen, hydrALAZINE, HYDROmorphone (DILAUDID) injection, ondansetron **OR** ondansetron (ZOFRAN) IV, oxyCODONE, technetium TC 77M diethylenetriame-pentaacetic acid   Assessment/Plan: 79 year old male with PMH of gout, HTN, CKD, dHF admitted earlier this month with GI bleed of unclear etiology and scheduled for outpatient capsule endoscopy.  He returns with dyspnea and melena found to be FOBT + with Hgb 6.  Acute blood loss anemia 2/2 GI bleed:  No source of bleeding on EGD, colonoscopy  or capsule endoscopy.  GI feels enteroscopy would be low yield at this point, suggest monitoring hgb and try and repeat imaging when actively bleeding. - hgb stable and asymptomatic so will d/c home - continue PPI at discharge - will need close follow-up and serial labs (every few days) with immediate eval for diagnostic procedure once hgb drops  - hgb check in [facility] on 08/26, then hgb check on 08/29 at [facility] - PCP appt on 08/31 with another hgb check if needed and to determine frequency of labs - patient instructed to contact [facility] if hgb drops below 7.5 (he is currently 8.3) and come to ED for evaluation and treatment (and hopefully GI intervention) - he should call 911/come to ED if symptomatic, regardless of hgb  Essential hypertension:  Stable.  Continue Coreg and losartan.  Diet:  Regular  VTE ppx:  SCDs, no pharmacologic VTE ppx due to GI bleed. Code:  Full   Dispo: Disposition is deferred at this time, awaiting improvement of current medical problems.  Anticipated discharge in approximately 1-2 day(s).   The patient does have a current PCP (Carlos Cheadle, MD) and does not need an [facility] hospital follow-up appointment after discharge.  The patient does not know have transportation limitations that hinder transportation to clinic appointments.  .Services Needed at time of discharge: Y = Yes, Blank = No PT:   OT:   RN:  Equipment:   Other:     LOS: 3 days   Carlos Manges, DO 08/22/2015, 7:50 AM

## 2015-08-22 NOTE — Progress Notes (Signed)
Subjective: [name] was evaluated this morning.  He ate some graham crackers but did not eat his breakfast.  He states he feels well with no more SOB.  He is able to go to the bathroom on his own with no difficulty.  He had a bowel movement that was not dark anymore.  Patient denies dizzyness, chest pain, abdominal pain, or peripheral edema.  Patient states he started having gout pain in left foot 1st metatarsal.  Patient has a hx of gout with approximately two attacks a year.    Objective: Vital signs in last 24 hours: Filed Vitals:   08/21/15 1317 08/21/15 1437 08/21/15 2117 08/22/15 0610  BP: 190/85 148/57 151/67 149/69  Pulse:  80 80 73  Temp:   99.3 F (37.4 C) 98.9 F (37.2 C)  TempSrc:   Oral Oral  Resp:   20 20  Height:      Weight:      SpO2:  100% 100% 100%   Weight change:   Intake/Output Summary (Last 24 hours) at 08/22/15 1032 Last data filed at 08/22/15 0600  Gross per 24 hour  Intake 423.67 ml  Output      0 ml  Net 423.67 ml   General: resting in bed HEENT: PERRL, EOMI, no scleral icterus Cardiac: Aortic Stenosis (not new), RRR, no rubs, or gallops Pulm: clear to auscultation bilaterally, moving normal volumes of air Abd: soft, nontender, nondistended, BS present Ext: slight swelling, non-erythematous, at left 1st metatarsal  rest of extremities warm and well perfused, no pedal edema Neuro: alert and oriented X3  Lab Results: Lab Results  Component Value Date   WBC 6.4 08/22/2015   HGB 8.3* 08/22/2015   HCT 26.2* 08/22/2015   MCV 79.6 08/22/2015   PLT 255 08/22/2015   BMP Latest Ref Rng 08/19/2015 08/18/2015 08/06/2015  Glucose 65 - 99 mg/dL 161(W) 960(A) 86  BUN 6 - 20 mg/dL 20 20 54(U)  Creatinine 0.61 - 1.24 mg/dL 9.81(X) 9.14(N) 8.29(F)  Sodium 135 - 145 mmol/L 139 142 140  Potassium 3.5 - 5.1 mmol/L 4.1 4.3 3.9  Chloride 101 - 111 mmol/L 108 111 109  CO2 22 - 32 mmol/L 23 21(L) 23  Calcium 8.9 - 10.3 mg/dL 6.2(Z) 8.9 3.0(Q)    Micro  Results: No results found for this or any previous visit (from the past 240 hour(s)). Studies/Results: Nm Pulmonary Perf And Vent  08/20/2015   CLINICAL DATA:  Shortness of breath for a few days worsened today  EXAM: NUCLEAR MEDICINE VENTILATION - PERFUSION LUNG SCAN  TECHNIQUE: Ventilation images were obtained in multiple projections using inhaled aerosol Tc-62m DTPA. Perfusion images were obtained in multiple projections after intravenous injection of Tc-39m MAA.  RADIOPHARMACEUTICALS:  42.5 mCi Technetium-74m DTPA aerosol inhalation and 6.1 mCi Technetium-67m MAA IV  COMPARISON:  None  Correlation: Chest radiograph 08/20/2015  FINDINGS: Ventilation: Significant central airway deposition of aerosol. Poor delivery of aerosol to the periphery of both lungs. Swallowed aerosol within stomach. Question large subsegmental ventilation defect in the anterior segment of the RIGHT upper lobe.  Perfusion: No segmental or subsegmental perfusion defects. Minimal irregularity of perfusion at the periphery of the RIGHT lung.  Chest radiograph:  Borderline enlargement of cardiac silhouette.  IMPRESSION: Very low probability for pulmonary embolism.   Electronically Signed   By: Ulyses Southward M.D.   On: 08/20/2015 17:09   Medications: I have reviewed the patient's current medications. Scheduled Meds: . carvedilol  25 mg Oral BID WC  .  losartan  25 mg Oral Daily  . pantoprazole (PROTONIX) IV  40 mg Intravenous Q12H   Continuous Infusions: . sodium chloride 20 mL/hr at 08/22/15 0249   PRN Meds:.acetaminophen **OR** acetaminophen, hydrALAZINE, HYDROmorphone (DILAUDID) injection, ondansetron **OR** ondansetron (ZOFRAN) IV, oxyCODONE, technetium TC 98M diethylenetriame-pentaacetic acid Assessment/Plan: Acute blood loss anemia: Current Hgb is 8.3 (previous Hgb was 8.4). This morning patient continues to show clinical improvement stating he no longer has SOB.  Patient's bowel movements have returned to brown color.  And  with Hgb stable in the past 72hrs anticipate discharge at this time.     - Have patient follow up with [facility] for Hgb check 2x weekly for signs of acute bleed -[place] [facility] appointment for lab on 8/29 at 9:15.  And appt with physician on 8/31 at 2pm.   Essential hypertension: Last 3 BP 148/57, 151/67, 149/69.  BP appears to be better controlled on Coreg and the addition of Losartan .  - Have patient follow up with Cardiology for BP medications.  Patient sees Dr.Harding on [facility] -Patient has appointment on 9/2 at 9:30 with PA - Continue Coreg and Losartan on discharge  Gout:  Patient complained of gout pain in left foot 1st metatarsal.  Patient takes allopurinol  daily at home.  Patient's 1st metatarsal of left foot appeared slightly swollen but not erythematous or warm to the touch - Resume Allopurinol on discharge   CKD (chronic kidney disease): Stable at baseline -continue to monitor  This is a Psychologist, occupational Note.  The care of the patient was discussed with Dr. Andrey Campanile and the assessment and plan formulated with their assistance.  Please see their attached note for official documentation of the daily encounter.   LOS: 3 days   Camelia Phenes, Med Student 08/22/2015, 10:32 AM

## 2015-08-22 NOTE — Clinical Documentation Improvement (Signed)
Internal Medicine  Can the diagnosis of CKD be further specified?   CKD Stage I - GFR greater than or equal to 90  CKD Stage II - GFR 60-89  CKD Stage III - GFR 30-59  CKD Stage IV - GFR 15-29  CKD Stage V - GFR < 15  ESRD (End Stage Renal Disease)  Other condition  Unable to clinically determine   Supporting Information: : (risk factors, signs and symptoms, diagnostics, treatment) BUN 20, creatinine 1.5; GFR 43.  Record states CKD and stable at present.  Please exercise your independent, professional judgment when responding. A specific answer is not anticipated or expected.   Thank You, Carlyn Reichert [facility] Health Information Management [place]

## 2015-08-22 NOTE — Progress Notes (Signed)
NURSING PROGRESS NOTE  [name] [id number] Discharge Data: 08/22/2015 5:18 PM Attending Provider: Burns Spain, MD [id number], Ayesha Rumpf, MD     [name] to be D/C'd Home per MD order.  Discussed with the patient the After Visit Summary and all questions fully answered. All IV's discontinued with no bleeding noted. All belongings returned to patient for patient to take home.   Last Vital Signs:  Blood pressure 170/61, pulse 74, temperature 97.8 F (36.6 C), temperature source Oral, resp. rate 18, height  (1.575 m), weight 66.225 kg (146 lb), SpO2 100 %.  Discharge Medication List   Medication List    STOP taking these medications        hydrALAZINE 25 MG tablet  Commonly known as:  APRESOLINE     isosorbide mononitrate 30 MG 24 hr tablet  Commonly known as:  IMDUR      TAKE these medications        albuterol 108 (90 BASE) MCG/ACT inhaler  Commonly known as:  PROVENTIL HFA;VENTOLIN HFA  Inhale 2 puffs into the lungs every 6 (six) hours as needed for wheezing or shortness of breath.     allopurinol 100 MG tablet  Commonly known as:  ZYLOPRIM  Take 1 tablet (100 mg total) by mouth daily.     atorvastatin 40 MG tablet  Commonly known as:  LIPITOR  Take 1 tablet (40 mg total) by mouth daily at 6 PM.     carvedilol 25 MG tablet  Commonly known as:  COREG  Take 1 tablet (25 mg total) by mouth 2 (two) times daily with a meal.     colchicine 0.6 MG tablet  Take 1 tablet (0.6 mg total) by mouth 2 (two) times daily.     losartan 25 MG tablet  Commonly known as:  COZAAR  Take 1 tablet (25 mg total) by mouth daily.     pantoprazole 40 MG tablet  Commonly known as:  PROTONIX  Take 1 tablet (40 mg total) by mouth daily.         Bennie Pierini, RN

## 2015-08-22 NOTE — Discharge Instructions (Addendum)
Contact [facility] and PCP if Patient's Hgb drops below 7.5. Send patient to ER if Hgb continues to drop and below 7.0 OR if symptoms like chest pain, short of breath, lightheaded.  We have given you colchicine for your gout flare.  Please do not take allopurinol for about 4 weeks until after this flare resolves (check with your doctor before you restart it).  We started a new blood pressure medication, Losartan.  Please take it once per day.  Continue to take Coreg twice per day.  STOP taking hydralazine.

## 2015-08-22 NOTE — Clinical Documentation Improvement (Signed)
Internal Medicine  Can the diagnosis of CHF be further specified?    Acuity - Acute, Chronic, Acute on Chronic  Other  Clinically Undetermined   Document any associated diagnoses/conditions   Supporting Information: Documented as unspecified diastolic heart failure.   Please exercise your independent, professional judgment when responding. A specific answer is not anticipated or expected.   Thank You,  Carlyn Reichert [facility] Health Information Management [employer]

## 2015-08-24 ENCOUNTER — Other Ambulatory Visit (INDEPENDENT_AMBULATORY_CARE_PROVIDER_SITE_OTHER): Payer: Medicaid Other

## 2015-08-24 ENCOUNTER — Other Ambulatory Visit: Payer: Self-pay

## 2015-08-24 DIAGNOSIS — D62 Acute posthemorrhagic anemia: Secondary | ICD-10-CM | POA: Diagnosis not present

## 2015-08-24 NOTE — Patient Outreach (Signed)
Third outreach attempt to reach patient to follow-up on stroke discharge order; HIPAA compliant voicemail left.

## 2015-08-25 LAB — CBC
Hematocrit: 26.8 % — ABNORMAL LOW (ref 37.5–51.0)
Hemoglobin: 8.5 g/dL — ABNORMAL LOW (ref 12.6–17.7)
MCH: 25.4 pg — AB (ref 26.6–33.0)
MCHC: 31.7 g/dL (ref 31.5–35.7)
MCV: 80 fL (ref 79–97)
PLATELETS: 249 10*3/uL (ref 150–379)
RBC: 3.35 x10E6/uL — AB (ref 4.14–5.80)
RDW: 18.3 % — ABNORMAL HIGH (ref 12.3–15.4)
WBC: 4.9 10*3/uL (ref 3.4–10.8)

## 2015-08-27 ENCOUNTER — Ambulatory Visit: Payer: Self-pay | Attending: Internal Medicine

## 2015-08-27 DIAGNOSIS — R799 Abnormal finding of blood chemistry, unspecified: Secondary | ICD-10-CM | POA: Insufficient documentation

## 2015-08-27 LAB — CBC WITH DIFFERENTIAL/PLATELET
BASOS ABS: 0 10*3/uL (ref 0.0–0.1)
Basophils Relative: 0 % (ref 0–1)
EOS ABS: 0.1 10*3/uL (ref 0.0–0.7)
EOS PCT: 1 % (ref 0–5)
HCT: 29.3 % — ABNORMAL LOW (ref 39.0–52.0)
Hemoglobin: 9 g/dL — ABNORMAL LOW (ref 13.0–17.0)
LYMPHS ABS: 1.3 10*3/uL (ref 0.7–4.0)
Lymphocytes Relative: 22 % (ref 12–46)
MCH: 24.5 pg — AB (ref 26.0–34.0)
MCHC: 30.7 g/dL (ref 30.0–36.0)
MCV: 79.8 fL (ref 78.0–100.0)
MPV: 9.4 fL (ref 8.6–12.4)
Monocytes Absolute: 1 10*3/uL (ref 0.1–1.0)
Monocytes Relative: 17 % — ABNORMAL HIGH (ref 3–12)
Neutro Abs: 3.5 10*3/uL (ref 1.7–7.7)
Neutrophils Relative %: 60 % (ref 43–77)
Platelets: 253 10*3/uL (ref 150–400)
RBC: 3.67 MIL/uL — ABNORMAL LOW (ref 4.22–5.81)
RDW: 18.1 % — AB (ref 11.5–15.5)
WBC: 5.9 10*3/uL (ref 4.0–10.5)

## 2015-08-27 LAB — COMPLETE METABOLIC PANEL WITH GFR
ALT: 19 U/L (ref 9–46)
AST: 13 U/L (ref 10–35)
Albumin: 3.8 g/dL (ref 3.6–5.1)
Alkaline Phosphatase: 109 U/L (ref 40–115)
BUN: 18 mg/dL (ref 7–25)
CHLORIDE: 105 mmol/L (ref 98–110)
CO2: 28 mmol/L (ref 20–31)
Calcium: 9 mg/dL (ref 8.6–10.3)
Creat: 1.32 mg/dL — ABNORMAL HIGH (ref 0.70–1.18)
GFR, EST AFRICAN AMERICAN: 60 mL/min (ref >=60)
GFR, EST NON AFRICAN AMERICAN: 52 mL/min — AB (ref >=60)
Glucose, Bld: 94 mg/dL (ref 65–99)
POTASSIUM: 4.7 mmol/L (ref 3.5–5.3)
Sodium: 142 mmol/L (ref 135–146)
Total Bilirubin: 0.4 mg/dL (ref 0.2–1.2)
Total Protein: 6.6 g/dL (ref 6.1–8.1)

## 2015-08-27 NOTE — Addendum Note (Signed)
Addended by: Bufford Spikes on: 08/27/2015 10:22 AM   Modules accepted: Orders

## 2015-08-29 ENCOUNTER — Encounter: Payer: Self-pay | Admitting: Physician Assistant

## 2015-08-29 ENCOUNTER — Ambulatory Visit: Payer: Self-pay | Attending: Physician Assistant | Admitting: Physician Assistant

## 2015-08-29 VITALS — BP 147/60 | HR 70 | Temp 98.1°F | Resp 18 | Ht 66.0 in | Wt 137.0 lb

## 2015-08-29 DIAGNOSIS — K219 Gastro-esophageal reflux disease without esophagitis: Secondary | ICD-10-CM | POA: Insufficient documentation

## 2015-08-29 DIAGNOSIS — Z79899 Other long term (current) drug therapy: Secondary | ICD-10-CM | POA: Insufficient documentation

## 2015-08-29 DIAGNOSIS — I509 Heart failure, unspecified: Secondary | ICD-10-CM | POA: Insufficient documentation

## 2015-08-29 DIAGNOSIS — D62 Acute posthemorrhagic anemia: Secondary | ICD-10-CM | POA: Insufficient documentation

## 2015-08-29 DIAGNOSIS — M109 Gout, unspecified: Secondary | ICD-10-CM | POA: Insufficient documentation

## 2015-08-29 DIAGNOSIS — K648 Other hemorrhoids: Secondary | ICD-10-CM | POA: Insufficient documentation

## 2015-08-29 DIAGNOSIS — I10 Essential (primary) hypertension: Secondary | ICD-10-CM | POA: Insufficient documentation

## 2015-08-29 LAB — CBC
HEMATOCRIT: 28.6 % — AB (ref 39.0–52.0)
HEMOGLOBIN: 8.9 g/dL — AB (ref 13.0–17.0)
MCH: 24.6 pg — ABNORMAL LOW (ref 26.0–34.0)
MCHC: 31.1 g/dL (ref 30.0–36.0)
MCV: 79 fL (ref 78.0–100.0)
MPV: 10.1 fL (ref 8.6–12.4)
Platelets: 248 10*3/uL (ref 150–400)
RBC: 3.62 MIL/uL — ABNORMAL LOW (ref 4.22–5.81)
RDW: 18.3 % — AB (ref 11.5–15.5)
WBC: 5.6 10*3/uL (ref 4.0–10.5)

## 2015-08-29 MED ORDER — ALLOPURINOL 100 MG PO TABS: 100.0000 mg | ORAL_TABLET | Freq: Every day | ORAL | Status: DC

## 2015-08-29 NOTE — Progress Notes (Addendum)
[name]  ZOX:[id number]  [id number]  DOB - December 05, 1936  Chief Complaint  Patient presents with  . Hospitalization Follow-up       Subjective:   [name] is a 79 y.o. male here today for  Hospital follow-up. He initially was in the hospital from August 7 the 12th where he presented with shortness of breath, weakness, and lightheadedness for 3 days prior. He also had been experiencing some dark stools for weeks prior. His hemoglobin was found to be 6.5. He was admitted to the hospital he underwent 2 units of blood transfusion. He was seen by gastroenterology with a thorough workup completed without significant findings for his bleeding. There was mention of AVMs, duodenitis , and internal hemorrhoids. He was released from the hospital with a hemoglobin of 8.5 on August 12th.  He represented to the hospital on August 21st with similar symptoms. At that time his hemoglobin was found to be 6 again. He was transfused another 2 units. GI saw him in consultation again and completed a capsule small bowel follow-through still without  A clear source for his bleeding. It is recommended now that he continues with serial CBC monitoring. He does not have an outpatient appointment currently with [facility]. We are instructed to call them if his hemoglobin drops below 7.5.   His hospital course was complicated by hypertension and a gout flare. He was treated with colchicine in the hospital good response.  He also missed his routine cardiology appointment while hospitalized.  His gout has improved now. He has no shortness of breath. He has no chest pain. His lightheadedness  Have subsided as well.   ROS: GEN: denies fever or chills, denies change in weight Skin: denies lesions or rashes HEENT: denies headache, earache, epistaxis, sore throat, or neck pain LUNGS: denies SHOB, dyspnea, PND, orthopnea CV: denies CP or palpitations GI: blood in stool  ALLERGIES: No Known Allergies  PAST  MEDICAL HISTORY: Past Medical History  Diagnosis Date  . Hypertension   . Gouty arthritis of toe of right foot   . GERD (gastroesophageal reflux disease)   . Shortness of breath dyspnea   . BPH (benign prostatic hyperplasia)     PAST SURGICAL HISTORY: Past Surgical History  Procedure Laterality Date  . Prostatectomy    . Esophagogastroduodenoscopy (egd) with propofol N/A 08/07/2015    Procedure: ESOPHAGOGASTRODUODENOSCOPY (EGD) WITH PROPOFOL;  Surgeon: Charlott Rakes, MD;  Location[facility] ENDOSCOPY;  Service: Endoscopy;  Laterality: N/A;  . Colonoscopy N/A 08/08/2015    Procedure: COLONOSCOPY;  Surgeon: Graylin Shiver, MD;  [facility]COPY;  Service: Endoscopy;  Laterality: N/A;  . Givens capsule study N/A 08/20/2015    Procedure: GIVENS CAPSULE STUDY;  Surgeon: Graylin Shiver, MD;  Location: [facility] ENDOSCOPY;  Service: Endoscopy;  Laterality: N/A;    MEDICATIONS AT HOME: Prior to Admission medications   Medication Sig Start Date End Date Taking? Authorizing Provider  albuterol (PROVENTIL HFA;VENTOLIN HFA) 108 (90 BASE) MCG/ACT inhaler Inhale 2 puffs into the lungs every 6 (six) hours as needed for wheezing or shortness of breath. 07/30/15  Yes Doris Cheadle, MD  atorvastatin (LIPITOR) 40 MG tablet Take 1 tablet (40 mg total) by mouth daily at 6 PM. Patient taking differently: Take 40 mg by mouth daily.  07/20/15  Yes Doris Cheadle, MD  carvedilol (COREG) 25 MG tablet Take 1 tablet (25 mg total) by mouth 2 (two) times daily with a meal. 07/20/15  Yes Doris Cheadle, MD  losartan (COZAAR) 25  MG tablet Take 1 tablet (25 mg total) by mouth daily. 08/22/15  Yes Ejiroghene E Emokpae, MD  pantoprazole (PROTONIX) 40 MG tablet Take 1 tablet (40 mg total) by mouth daily. 08/10/15  Yes Yolanda Manges, DO  allopurinol (ZYLOPRIM) 100 MG tablet Take 1 tablet (100 mg total) by mouth daily. 08/29/15   Vivianne Master, PA-C  colchicine 0.6 MG tablet Take 1 tablet (0.6 mg total) by mouth 2 (two) times  daily. Patient not taking: Reported on 08/29/2015 07/20/15   Doris Cheadle, MD  colchicine 0.6 MG tablet Take 1 tablet (0.6 mg total) by mouth daily. Take until flare subsides. Patient not taking: Reported on 08/29/2015 08/22/15   Yolanda Manges, DO     Objective:   Filed Vitals:   08/29/15 1415  BP: 147/60  Pulse: 70  Temp: 98.1 F (36.7 C)  TempSrc: Oral  Resp: 18  Height: 5\' 6"  (1.676 m)  Weight: 137 lb (62.143 kg)  SpO2: 99%    Exam General appearance : Awake, alert, not in any distress. Speech Clear. Not toxic looking Chest:Good air entry bilaterally, no added sounds  CVS: S1 S2 regular, 3/6 murmur.  Abdomen: Bowel sounds present, Non tender and not distended with no gaurding, rigidity or rebound. Extremities: B/L Lower Ext shows no edema, both legs are warm to touch Neurology: Awake alert, and oriented X 3, CN II-XII intact, Non focal   Data Review Lab Results  Component Value Date   HGBA1C 6.2* 05/29/2015     Assessment & Plan  1. Acute blood loss anemia  -serial CBCs  -2 week f/u with PCP  -[facility] prn 2. Internal hemmorroids  -Cont OTC creams  -Gen surgery referral  -monitor CBC 3. Gout   -refill Allopurinol 4. Mod AS/dHF  -appt with [facility] rescheduled for 9/2  5. HTN  -Cont current meds, will uptitrate Cozaar as needed  Return in about 2 weeks (around 09/12/2015).  The patient was given clear instructions to go to ER or return to medical center if symptoms don't improve, worsen or new problems develop. The patient verbalized understanding. The patient was told to call to get lab results if they haven't heard anything in the next week.   This note has been created with Education officer, environmental. Any transcriptional errors are unintentional.    Scot Jun, PA-C [facility] [place], [place] [phone]   08/29/2015, 2:43 PM

## 2015-08-29 NOTE — Progress Notes (Signed)
Interpreter [phone] Patient here for hospital follow up, was in hospital for dizziness and SOB. Hemoglobin was 6 at hospital, received 2 bags of blood. Endoscopy and colonoscopy showed everything is normal. Hospital request hemoglobin to be checked every 2 days. Hemoglobin was checked here 2 days ago, results 9.   Hospital stopped allopurinol and gave colchicine due to acute gout attack. Told patient to see provider to restart allopurinol.

## 2015-08-31 ENCOUNTER — Encounter: Payer: Self-pay | Admitting: Physician Assistant

## 2015-08-31 ENCOUNTER — Ambulatory Visit (INDEPENDENT_AMBULATORY_CARE_PROVIDER_SITE_OTHER): Payer: Medicaid Other | Admitting: Physician Assistant

## 2015-08-31 VITALS — BP 150/70 | HR 68 | Ht 66.0 in | Wt 139.2 lb

## 2015-08-31 DIAGNOSIS — I10 Essential (primary) hypertension: Secondary | ICD-10-CM

## 2015-08-31 DIAGNOSIS — I5032 Chronic diastolic (congestive) heart failure: Secondary | ICD-10-CM

## 2015-08-31 DIAGNOSIS — I350 Nonrheumatic aortic (valve) stenosis: Secondary | ICD-10-CM | POA: Insufficient documentation

## 2015-08-31 MED ORDER — LOSARTAN POTASSIUM 50 MG PO TABS: 50.0000 mg | ORAL_TABLET | Freq: Every day | ORAL | Status: DC

## 2015-08-31 NOTE — Progress Notes (Signed)
Patient ID: [name], male   DOB: 1936/05/11, 79 y.o.   MRN: [id number]     Date:  08/31/2015   ID:  [name], DOB 03/01/1936, MRN [id number]  PCP:  Doris Cheadle, MD    Primary Cardiologist:  Herbie Baltimore  Chief Complaint  Patient presents with  . Hospitalization Follow-up    pt was having dizziness and SOB, pt recieved to bag of blood while in the hospital for anemia, pt stated he doing good     History of Present Illness: [name] is a 79 y.o. male with a history of hypertension, gouty arthritis, GERD, BPH. Echocardiogram 05/29/2015 which revealed an ejection fraction of 65-70% with normal wall motion. Grade 1 diastolic dysfunction. Moderate aortic stenosis with peak and mean gradients of 45 and 26 mmHg, AVA 1.1 cm. Mild AR, MR an TR. Peak PA pressure 32 mmHg.  He was admitted in June 2016 with acute diastolic heart failure.  He underwent Lexiscan stress testing during that admission which was considered low risk with no reversible ischemia.  He was admitted again in August 2016 with acute blood loss anemia due to GI bleed.  , globin at presentation was 6 and he was transfused 2 units of blood.    He is here for posthospital follow-up.  His family is here and helped translate.  Patient is feeling much better after his most recent hospitalization. Hemoglobin has been stable and last was 8.9.  He denies being tired or fatigued. No shortness of breath or orthopnea.  He also denies nausea, vomiting, fever, chest pain, dizziness, PND, cough, congestion, abdominal pain, hematochezia, melena, lower extremity edema, claudication.  Weight is stable.    Wt Readings from Last 3 Encounters:  08/31/15 139 lb 3.2 oz (63.141 kg)  08/29/15 137 lb (62.143 kg)  08/18/15 146 lb (66.225 kg)     Past Medical History  Diagnosis Date  . Hypertension   . Gouty arthritis of toe of right foot   . GERD (gastroesophageal reflux disease)   . Shortness of breath dyspnea   . BPH (benign prostatic  hyperplasia)     Current Outpatient Prescriptions  Medication Sig Dispense Refill  . albuterol (PROVENTIL HFA;VENTOLIN HFA) 108 (90 BASE) MCG/ACT inhaler Inhale 2 puffs into the lungs every 6 (six) hours as needed for wheezing or shortness of breath. 1 Inhaler 0  . allopurinol (ZYLOPRIM) 100 MG tablet Take 1 tablet (100 mg total) by mouth daily. 30 tablet 6  . atorvastatin (LIPITOR) 40 MG tablet Take 1 tablet (40 mg total) by mouth daily at 6 PM. (Patient taking differently: Take 40 mg by mouth daily. ) 90 tablet 1  . carvedilol (COREG) 25 MG tablet Take 1 tablet (25 mg total) by mouth 2 (two) times daily with a meal. 60 tablet 1  . colchicine 0.6 MG tablet Take 1 tablet (0.6 mg total) by mouth 2 (two) times daily. (Patient taking differently: Take 0.6 mg by mouth as needed. ) 28 tablet 0  . pantoprazole (PROTONIX) 40 MG tablet Take 1 tablet (40 mg total) by mouth daily. 30 tablet 0   No current facility-administered medications for this visit.    Allergies:   No Known Allergies  Social History:  The patient  reports that he has never smoked. He does not have any smokeless tobacco history on file. He reports that he does not drink alcohol or use illicit drugs.   Family history:   Family History  Problem Relation Age of Onset  . Hypertension  Father   . Hypertension Sister   . Hypertension Brother     ROS:  Please see the history of present illness.  All other systems reviewed and negative.   PHYSICAL EXAM: VS:  BP 150/70 mmHg  Pulse 68  Ht 5\' 6"  (1.676 m)  Wt 139 lb 3.2 oz (63.141 kg)  BMI 22.48 kg/m2 Well nourished, well developed, in no acute distress HEENT: Pupils are equal round react to light accommodation extraocular movements are intact.  Neck: no JVDNo cervical lymphadenopathy. Cardiac: Regular rate and rhythm without murmurs rubs or gallops. Lungs:  clear to auscultation bilaterally, no wheezing, rhonchi or rales Abd: soft, nontender, positive bowel sounds all  quadrants, no hepatosplenomegaly Ext: no lower extremity edema.  2+ radial and dorsalis pedis pulses. Skin: warm and dry Neuro:  Grossly normal  EKG:  Normal sinus rhythm rate 60 bpm deep T-wave inversions laterally mild T-wave inversion inferiorly.    ASSESSMENT AND PLAN:  Problem List Items Addressed This Visit    Hypertension   Diastolic CHF - Primary   Aortic stenosis, moderate     Essential hypertension: I reviewed the last couple of notes in the computer and his blood pressures been elevated each time. Increase Cozaar to 50 mg daily.  Aortic stenosis, moderate:  Will need to continue to monitor with annual echocardiograms.  Chronic Diastolic heart failure:  He appears euvolemic. He is not requiring any diaphoretic.  Discussed daily weight monitoring, when to call the office and symptoms of heart failure.Marland Kitchen

## 2015-08-31 NOTE — Patient Instructions (Signed)
INCREASE LOSARTAN (COZAAR) TO 50 MG ONE TABLET DAILY. MEDICATION SENT TO PHARMACY.  MAY DOUBLE(TAKE 2 TABLET) LOSARTAN 25 MG  A DAY TO EMPTY THE BOTTLE AND THEN START NEW MEDICATIONS  CONTINUE ALL OTHER MEDICATIONS.   Your physician recommends that you schedule a follow-up appointment in 3 MONTHS WITH DR HARDING -30 MIN APPOINTMENT.Marland Kitchen

## 2015-09-04 ENCOUNTER — Ambulatory Visit: Payer: Self-pay | Attending: Family Medicine

## 2015-09-04 DIAGNOSIS — D62 Acute posthemorrhagic anemia: Secondary | ICD-10-CM

## 2015-09-04 LAB — CBC
HCT: 26.8 % — ABNORMAL LOW (ref 39.0–52.0)
HEMOGLOBIN: 8.5 g/dL — AB (ref 13.0–17.0)
MCH: 24.4 pg — ABNORMAL LOW (ref 26.0–34.0)
MCHC: 31.7 g/dL (ref 30.0–36.0)
MCV: 76.8 fL — ABNORMAL LOW (ref 78.0–100.0)
MPV: 9.7 fL (ref 8.6–12.4)
Platelets: 167 10*3/uL (ref 150–400)
RBC: 3.49 MIL/uL — AB (ref 4.22–5.81)
RDW: 18.9 % — ABNORMAL HIGH (ref 11.5–15.5)
WBC: 6.2 10*3/uL (ref 4.0–10.5)

## 2015-09-24 ENCOUNTER — Ambulatory Visit: Payer: Medicaid Other | Attending: Family Medicine | Admitting: Family Medicine

## 2015-09-24 ENCOUNTER — Ambulatory Visit: Payer: Medicaid Other | Admitting: Family Medicine

## 2015-09-24 ENCOUNTER — Encounter: Payer: Self-pay | Admitting: Family Medicine

## 2015-09-24 VITALS — BP 133/68 | HR 61 | Temp 97.4°F | Resp 16 | Ht 64.0 in | Wt 137.0 lb

## 2015-09-24 DIAGNOSIS — I10 Essential (primary) hypertension: Secondary | ICD-10-CM | POA: Diagnosis not present

## 2015-09-24 DIAGNOSIS — R7989 Other specified abnormal findings of blood chemistry: Secondary | ICD-10-CM

## 2015-09-24 DIAGNOSIS — Z0000 Encounter for general adult medical examination without abnormal findings: Secondary | ICD-10-CM

## 2015-09-24 DIAGNOSIS — E790 Hyperuricemia without signs of inflammatory arthritis and tophaceous disease: Secondary | ICD-10-CM | POA: Insufficient documentation

## 2015-09-24 DIAGNOSIS — Z79899 Other long term (current) drug therapy: Secondary | ICD-10-CM | POA: Insufficient documentation

## 2015-09-24 DIAGNOSIS — M25519 Pain in unspecified shoulder: Secondary | ICD-10-CM | POA: Insufficient documentation

## 2015-09-24 DIAGNOSIS — R0602 Shortness of breath: Secondary | ICD-10-CM | POA: Diagnosis not present

## 2015-09-24 DIAGNOSIS — D62 Acute posthemorrhagic anemia: Secondary | ICD-10-CM | POA: Insufficient documentation

## 2015-09-24 DIAGNOSIS — M109 Gout, unspecified: Secondary | ICD-10-CM | POA: Diagnosis not present

## 2015-09-24 DIAGNOSIS — K922 Gastrointestinal hemorrhage, unspecified: Secondary | ICD-10-CM | POA: Insufficient documentation

## 2015-09-24 DIAGNOSIS — K648 Other hemorrhoids: Secondary | ICD-10-CM | POA: Insufficient documentation

## 2015-09-24 DIAGNOSIS — M25511 Pain in right shoulder: Secondary | ICD-10-CM | POA: Insufficient documentation

## 2015-09-24 LAB — CBC
HCT: 29.6 % — ABNORMAL LOW (ref 39.0–52.0)
Hemoglobin: 9 g/dL — ABNORMAL LOW (ref 13.0–17.0)
MCH: 22.9 pg — AB (ref 26.0–34.0)
MCHC: 30.4 g/dL (ref 30.0–36.0)
MCV: 75.3 fL — ABNORMAL LOW (ref 78.0–100.0)
MPV: 9.7 fL (ref 8.6–12.4)
PLATELETS: 175 10*3/uL (ref 150–400)
RBC: 3.93 MIL/uL — ABNORMAL LOW (ref 4.22–5.81)
RDW: 18.3 % — AB (ref 11.5–15.5)
WBC: 5.5 10*3/uL (ref 4.0–10.5)

## 2015-09-24 LAB — URIC ACID: Uric Acid, Serum: 5.7 mg/dL (ref 4.0–7.8)

## 2015-09-24 MED ORDER — LOSARTAN POTASSIUM 50 MG PO TABS: 50.0000 mg | ORAL_TABLET | Freq: Every day | ORAL | Status: AC

## 2015-09-24 MED ORDER — ATORVASTATIN CALCIUM 40 MG PO TABS: 40.0000 mg | ORAL_TABLET | Freq: Every day | ORAL | Status: AC

## 2015-09-24 MED ORDER — CARVEDILOL 25 MG PO TABS: 25.0000 mg | ORAL_TABLET | Freq: Two times a day (BID) | ORAL | Status: AC

## 2015-09-24 MED ORDER — ACETAMINOPHEN ER 650 MG PO TBCR: 650.0000 mg | EXTENDED_RELEASE_TABLET | Freq: Three times a day (TID) | ORAL | Status: AC | PRN

## 2015-09-24 MED ORDER — PANTOPRAZOLE SODIUM 40 MG PO TBEC: 40.0000 mg | DELAYED_RELEASE_TABLET | Freq: Every day | ORAL | Status: AC

## 2015-09-24 MED ORDER — PANTOPRAZOLE SODIUM 40 MG PO TBEC: 40.0000 mg | DELAYED_RELEASE_TABLET | Freq: Every day | ORAL | Status: DC

## 2015-09-24 MED ORDER — ALLOPURINOL 100 MG PO TABS: 100.0000 mg | ORAL_TABLET | Freq: Every day | ORAL | Status: AC

## 2015-09-24 MED ORDER — FERROUS SULFATE 325 (65 FE) MG PO TABS: 325.0000 mg | ORAL_TABLET | Freq: Two times a day (BID) | ORAL | Status: AC

## 2015-09-24 MED ORDER — ALBUTEROL SULFATE HFA 108 (90 BASE) MCG/ACT IN AERS: 2.0000 | INHALATION_SPRAY | Freq: Four times a day (QID) | RESPIRATORY_TRACT | Status: AC | PRN

## 2015-09-24 MED ORDER — SUCRALFATE 1 G PO TABS: 1.0000 g | ORAL_TABLET | Freq: Three times a day (TID) | ORAL | Status: AC

## 2015-09-24 NOTE — Patient Instructions (Signed)
[name], Carlos Thank you for coming in today. Rechecking CBC Start iron to help recover Hgb levels  [name] was seen today for hypertension and anemia.  Diagnoses and all orders for this visit:  Gastrointestinal hemorrhage, unspecified gastritis, unspecified gastrointestinal hemorrhage type -     Discontinue: pantoprazole (PROTONIX) 40 MG tablet; Take 1 tablet (40 mg total) by mouth daily. -     CBC -     pantoprazole (PROTONIX) 40 MG tablet; Take 1 tablet (40 mg total) by mouth daily. -     sucralfate (CARAFATE) 1 G tablet; Take 1 tablet (1 g total) by mouth 3 (three) times daily with meals. -     ferrous sulfate 325 (65 FE) MG tablet; Take 1 tablet (325 mg total) by mouth 2 (two) times daily with a meal.  Acute blood loss anemia -     Discontinue: pantoprazole (PROTONIX) 40 MG tablet; Take 1 tablet (40 mg total) by mouth daily. -     CBC -     pantoprazole (PROTONIX) 40 MG tablet; Take 1 tablet (40 mg total) by mouth daily.  Gout of right foot, unspecified cause, unspecified chronicity -     allopurinol (ZYLOPRIM) 100 MG tablet; Take 1 tablet (100 mg total) by mouth daily.  SOB (shortness of breath) -     albuterol (PROVENTIL HFA;VENTOLIN HFA) 108 (90 BASE) MCG/ACT inhaler; Inhale 2 puffs into the lungs every 6 (six) hours as needed for wheezing or shortness of breath.  Essential hypertension  Elevated uric acid in blood -     Uric Acid  Pain in joint, shoulder region, right -     acetaminophen (TYLENOL 8 HOUR) 650 MG CR tablet; Take 1 tablet (650 mg total) by mouth every 8 (eight) hours as needed for pain (joint pain).  Other orders -     Flu Vaccine QUAD 36+ mos IM -     losartan (COZAAR) 50 MG tablet; Take 1 tablet (50 mg total) by mouth daily. -     atorvastatin (LIPITOR) 40 MG tablet; Take 1 tablet (40 mg total) by mouth daily. -     carvedilol (COREG) 25 MG tablet; Take 1 tablet (25 mg total) by mouth 2 (two) times daily with a meal.   F/u in 3 months once you return  from [place]  Dr. Armen Pickup

## 2015-09-24 NOTE — Assessment & Plan Note (Signed)
A; R shoulder joint pain, suspect primary OA P: Tylenol ROM exercises  Avoid NSAID due to recent GI bleed

## 2015-09-24 NOTE — Progress Notes (Signed)
Used [employer] Arabic Interpreter [phone] F/U HTN  BP was elevated yesterday 170/80 No hx tobacco

## 2015-09-24 NOTE — Assessment & Plan Note (Signed)
A: GI bleed from unknown site, internal hemorrhoids are not likely to result in Hgb drop as significant as the patient experienced. Hemodynamically stable. P: CBC today Start oral iron Refilled protonix Ordered carafate with plan to start prn Hgb down trending from last check

## 2015-09-24 NOTE — Progress Notes (Signed)
Patient ID: [name], male   DOB: 04/12/1936, 79 y.o.   MRN: [id number]   Subjective:  Patient ID: [name], male    DOB: 1936/04/05  Age: 79 y.o. MRN: [id number]  CC: Hypertension and Anemia   HPI [name] presents for f/u GI bleeding. Patient presents with his children.   1. GI bleed: Hgb down to 6 in setting internal hemorrhoids confirmed on colonoscopy and relatively normal EGD except for minimal duodenitis on month 7 weeks ago. Patient was symptomatic at the time with SOB and leg swelling. He is no longer SOB and does not have leg swelling. Patient has been taking PPI. Last Hgb 8.9. Not on iron therapy. No CP or SOB. Travelling to [place] for 3-4 months in 4 days. Requesting 3 months supply on all meds.    2. R shoulder pain: x 1 week. Pain with abduction. No trauma. No joint swelling.   3. HTN: taking coreg and losartan. No edema, CP or SOB.   Social History  Substance Use Topics  . Smoking status: Never Smoker   . Smokeless tobacco: Not on file  . Alcohol Use: No   Outpatient Prescriptions Prior to Visit  Medication Sig Dispense Refill  . albuterol (PROVENTIL HFA;VENTOLIN HFA) 108 (90 BASE) MCG/ACT inhaler Inhale 2 puffs into the lungs every 6 (six) hours as needed for wheezing or shortness of breath. 1 Inhaler 0  . allopurinol (ZYLOPRIM) 100 MG tablet Take 1 tablet (100 mg total) by mouth daily. 30 tablet 6  . atorvastatin (LIPITOR) 40 MG tablet Take 1 tablet (40 mg total) by mouth daily at 6 PM. (Patient taking differently: Take 40 mg by mouth daily. ) 90 tablet 1  . carvedilol (COREG) 25 MG tablet Take 1 tablet (25 mg total) by mouth 2 (two) times daily with a meal. 60 tablet 1  . losartan (COZAAR) 50 MG tablet Take 1 tablet (50 mg total) by mouth daily. 90 tablet 3  . pantoprazole (PROTONIX) 40 MG tablet Take 1 tablet (40 mg total) by mouth daily. 30 tablet 0  . colchicine 0.6 MG tablet Take 1 tablet (0.6 mg total) by mouth 2 (two) times daily. (Patient not taking:  Reported on 09/24/2015) 28 tablet 0   No facility-administered medications prior to visit.    ROS Review of Systems  Constitutional: Negative for fever, chills, fatigue and unexpected weight change.  Eyes: Negative for visual disturbance.  Respiratory: Negative for cough and shortness of breath.   Cardiovascular: Negative for chest pain, palpitations and leg swelling.  Gastrointestinal: Negative for nausea, vomiting, abdominal pain, diarrhea, constipation and blood in stool.  Endocrine: Negative for polydipsia, polyphagia and polyuria.  Musculoskeletal: Positive for arthralgias. Negative for myalgias, back pain, gait problem and neck pain.  Skin: Negative for rash.  Allergic/Immunologic: Negative for immunocompromised state.  Hematological: Negative for adenopathy. Does not bruise/bleed easily.  Psychiatric/Behavioral: Negative for suicidal ideas, sleep disturbance and dysphoric mood. The patient is not nervous/anxious.     Objective:  BP 133/68 mmHg  Pulse 61  Temp(Src) 97.4 F (36.3 C) (Oral)  Resp 16  Ht  (1.626 m)  Wt 137 lb (62.143 kg)  BMI 23.50 kg/m2  SpO2 99%  BP/Weight 09/24/2015 08/31/2015 08/29/2015  Systolic BP 133 150 147  Diastolic BP 68 70 60  Wt. (Lbs) 137 139.2 137  BMI 23.5 22.48 22.12    Physical Exam  Constitutional: He appears well-developed and well-nourished. No distress.  HENT:  Head: Normocephalic and atraumatic.  Neck: Normal  range of motion. Neck supple.  Cardiovascular: Normal rate, regular rhythm, normal heart sounds and intact distal pulses.   Pulmonary/Chest: Effort normal and breath sounds normal.  Musculoskeletal: He exhibits no edema or tenderness.       Right shoulder: He exhibits decreased range of motion and crepitus. He exhibits no tenderness, no bony tenderness, no swelling, no effusion and normal strength.  Neurological: He is alert.  Skin: Skin is warm and dry. No rash noted. No erythema.  Psychiatric: He has a normal mood and  affect.    Assessment & Plan:   Problem List Items Addressed This Visit    Acute blood loss anemia   Relevant Medications   ferrous sulfate 325 (65 FE) MG tablet   Elevated uric acid in blood   Relevant Orders   Uric Acid   GI bleed - Primary    A: GI bleed from unknown site, internal hemorrhoids are not likely to result in Hgb drop as significant as the patient experienced. Hemodynamically stable. P: CBC today Start oral iron Refilled protonix Ordered carafate with plan to start prn Hgb down trending from last check       Relevant Medications   pantoprazole (PROTONIX) 40 MG tablet   sucralfate (CARAFATE) 1 G tablet   ferrous sulfate 325 (65 FE) MG tablet   Other Relevant Orders   CBC   Hypertension (Chronic)   Relevant Medications   losartan (COZAAR) 50 MG tablet   atorvastatin (LIPITOR) 40 MG tablet   carvedilol (COREG) 25 MG tablet   Pain in joint, shoulder region    A; R shoulder joint pain, suspect primary OA P: Tylenol ROM exercises  Avoid NSAID due to recent GI bleed       Relevant Medications   acetaminophen (TYLENOL 8 HOUR) 650 MG CR tablet   RESOLVED: SOB (shortness of breath)   Relevant Medications   albuterol (PROVENTIL HFA;VENTOLIN HFA) 108 (90 BASE) MCG/ACT inhaler    Other Visit Diagnoses    Gout of right foot, unspecified cause, unspecified chronicity        Relevant Medications    allopurinol (ZYLOPRIM) 100 MG tablet    acetaminophen (TYLENOL 8 HOUR) 650 MG CR tablet    Healthcare maintenance        Relevant Orders    Flu Vaccine QUAD 36+ mos IM (Completed)       No orders of the defined types were placed in this encounter.    Follow-up: No Follow-up on file.   Dessa Phi MD

## 2015-09-25 ENCOUNTER — Telehealth: Payer: Self-pay | Admitting: *Deleted

## 2015-09-25 NOTE — Telephone Encounter (Signed)
Unable to contact pt  Voice mail is full 

## 2015-09-25 NOTE — Telephone Encounter (Signed)
-----   Message from Dessa Phi, MD sent at 09/25/2015 10:36 AM EDT ----- Hgb 9.0 Uric acid down to 5.7  No need for carafate  Do take iron and continue protonix

## 2015-09-25 NOTE — Telephone Encounter (Signed)
Date of birth verified by emergency contact  Lab Results given  Advised to stop taking Carafate. Continue taking iron and Protonix Pt emergency contact verbalized understanding

## 2015-12-04 ENCOUNTER — Ambulatory Visit: Payer: Medicaid Other | Admitting: Cardiology

## 2015-12-04 DIAGNOSIS — R0989 Other specified symptoms and signs involving the circulatory and respiratory systems: Secondary | ICD-10-CM

## 2015-12-12 ENCOUNTER — Encounter: Payer: Self-pay | Admitting: Cardiology

## 2017-02-17 IMAGING — DX DG CHEST 2V
2 series · 2 of 2 positions shown · non-contrast
Comparison: None.

CLINICAL DATA: Shortness of breath.

EXAM:
CHEST  2 VIEW

[w chest pa]
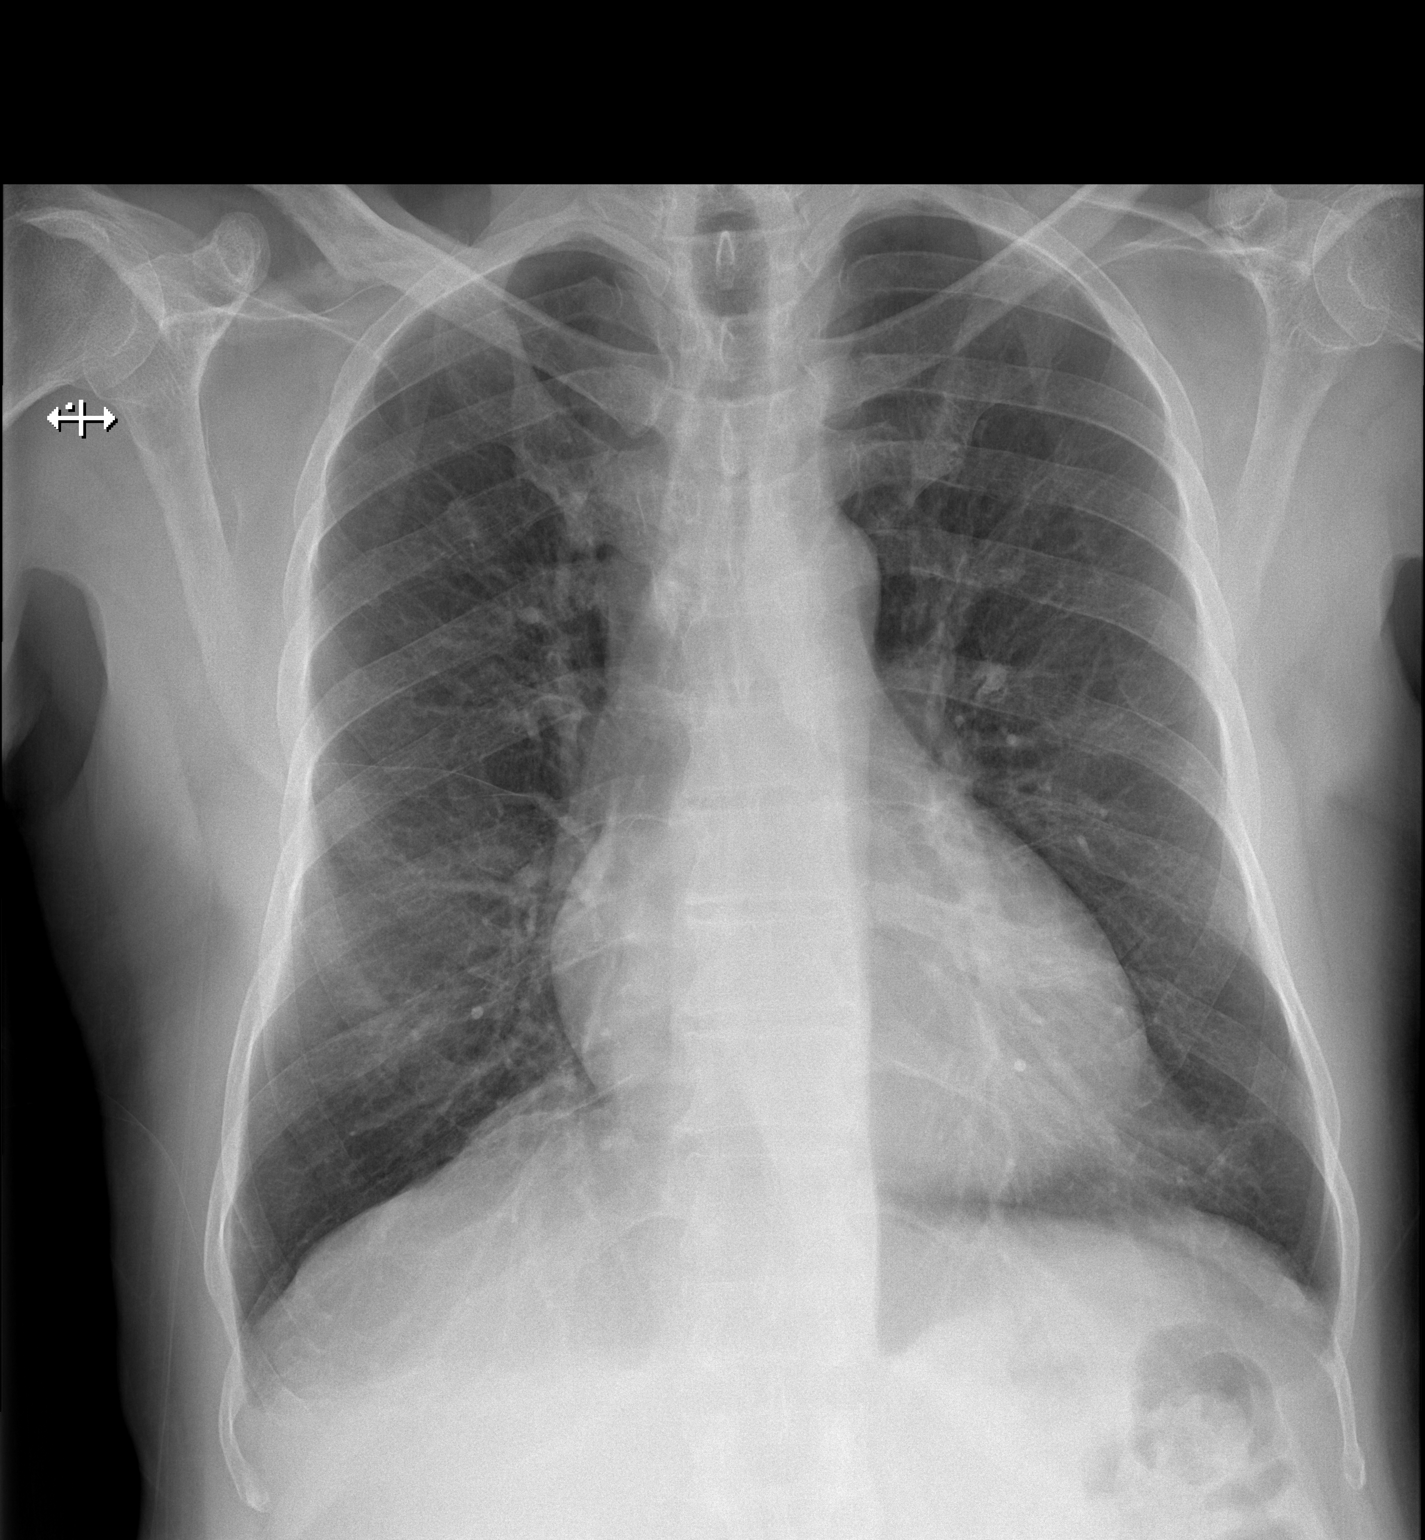

[w chest lat]
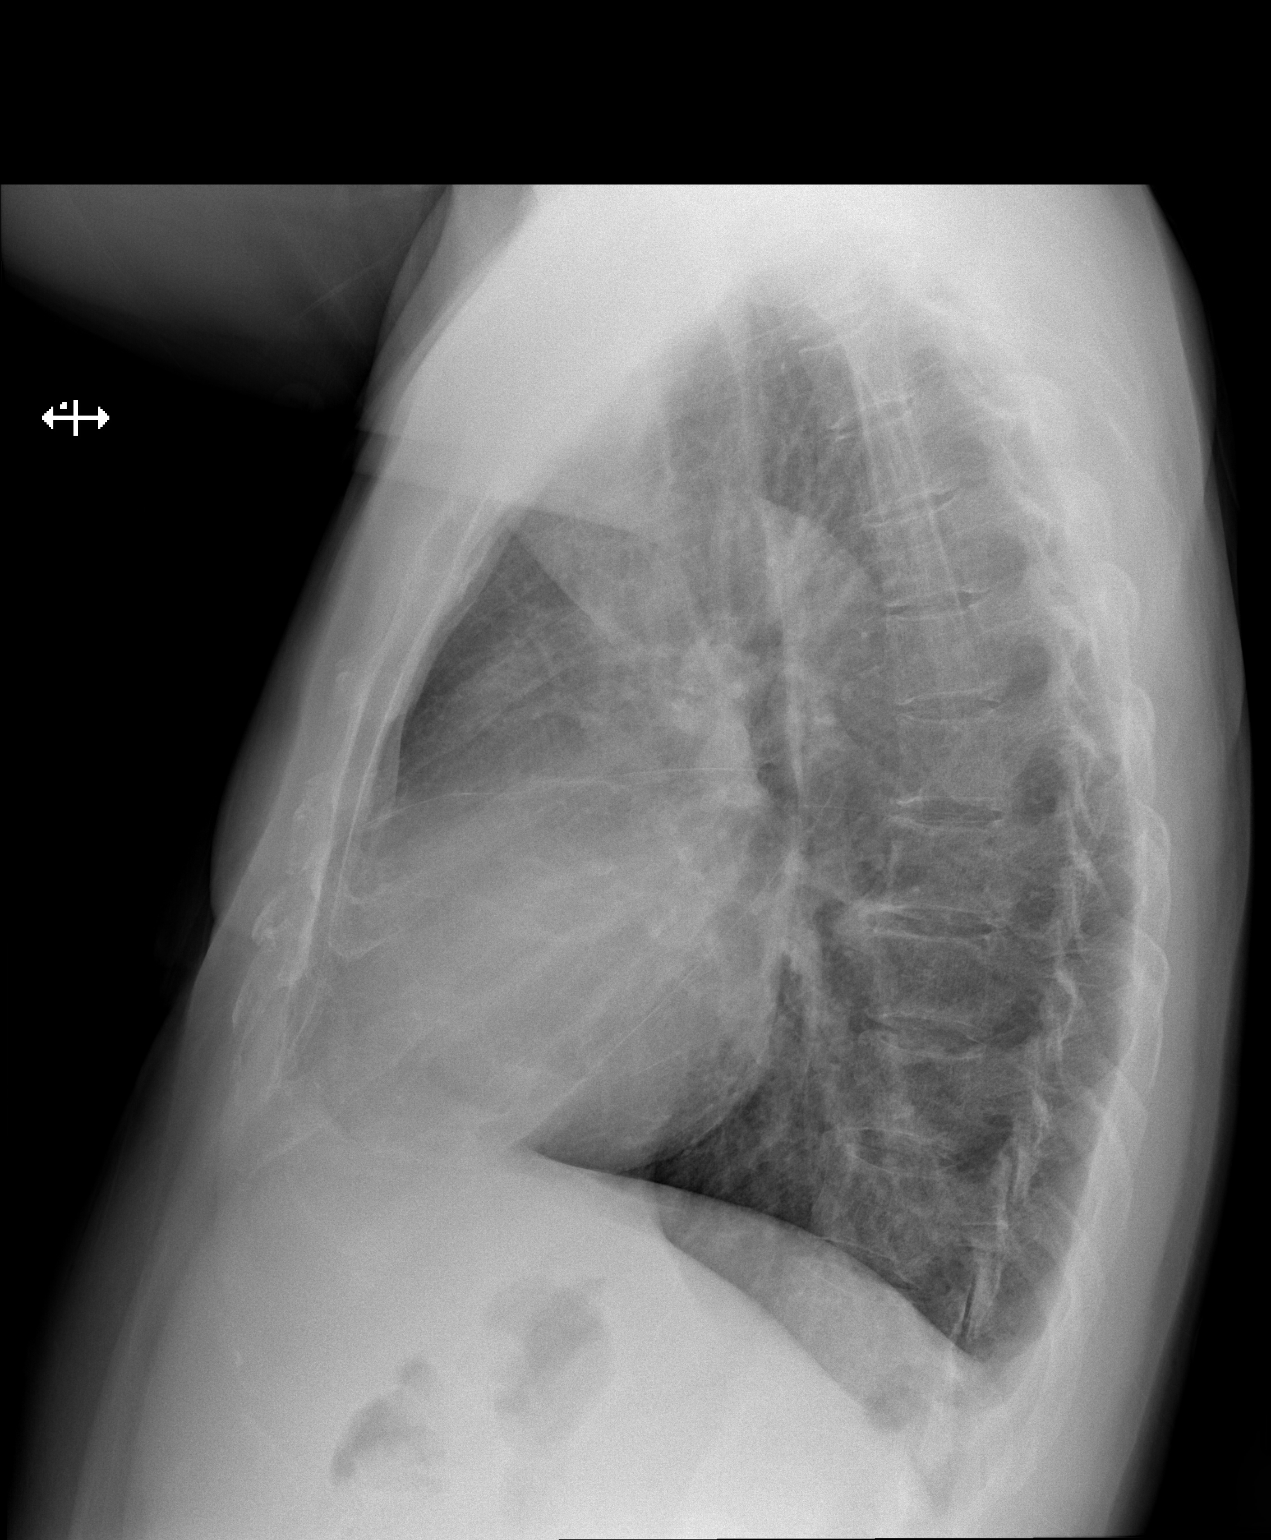

[2 of 2 positions shown; findings below may reference images not displayed]

FINDINGS: Mediastinum and hilar structures are normal. Cardiomegaly with
normal pulmonary vascularity. Bilateral interstitial prominence
noted consistent with mild pneumonitis. No acute bony abnormality
identified.
IMPRESSION: 1. Bilateral mild pulmonary interstitial prominence suggesting
pneumonitis.
2. Cardiomegaly.  No pulmonary venous congestion.
